# Patient Record
Sex: Male | Born: 1960 | Race: White | Hispanic: No | Marital: Married | Smoking: Never smoker
Health system: Southern US, Community
[De-identification: ages and names within clinical notes are randomized; demographics above are authoritative.]

## PROBLEM LIST (undated history)

## (undated) DIAGNOSIS — I471 Supraventricular tachycardia: Secondary | ICD-10-CM

## (undated) DIAGNOSIS — K219 Gastro-esophageal reflux disease without esophagitis: Secondary | ICD-10-CM

## (undated) DIAGNOSIS — I2510 Atherosclerotic heart disease of native coronary artery without angina pectoris: Secondary | ICD-10-CM

## (undated) DIAGNOSIS — F419 Anxiety disorder, unspecified: Secondary | ICD-10-CM

## (undated) DIAGNOSIS — I4719 Other supraventricular tachycardia: Secondary | ICD-10-CM

## (undated) DIAGNOSIS — M1990 Unspecified osteoarthritis, unspecified site: Secondary | ICD-10-CM

## (undated) DIAGNOSIS — E785 Hyperlipidemia, unspecified: Secondary | ICD-10-CM

## (undated) DIAGNOSIS — I10 Essential (primary) hypertension: Secondary | ICD-10-CM

## (undated) HISTORY — DX: Hyperlipidemia, unspecified: E78.5

## (undated) HISTORY — DX: Other supraventricular tachycardia: I47.19

## (undated) HISTORY — PX: VASECTOMY: SHX75

## (undated) HISTORY — DX: Anxiety disorder, unspecified: F41.9

## (undated) HISTORY — DX: Essential (primary) hypertension: I10

## (undated) HISTORY — DX: Thoracic aortic ectasia: I77.810

## (undated) HISTORY — PX: CARDIAC CATHETERIZATION: SHX172

## (undated) HISTORY — PX: COLONOSCOPY: SHX174

## (undated) HISTORY — DX: Supraventricular tachycardia: I47.1

## (undated) HISTORY — DX: Unspecified osteoarthritis, unspecified site: M19.90

## (undated) HISTORY — DX: Gastro-esophageal reflux disease without esophagitis: K21.9

---

## 2001-12-14 ENCOUNTER — Ambulatory Visit (HOSPITAL_COMMUNITY): Admission: RE | Admit: 2001-12-14 | Discharge: 2001-12-14 | Payer: Self-pay | Admitting: Gastroenterology

## 2015-12-25 ENCOUNTER — Encounter: Payer: Self-pay | Admitting: *Deleted

## 2016-01-07 ENCOUNTER — Ambulatory Visit (INDEPENDENT_AMBULATORY_CARE_PROVIDER_SITE_OTHER): Payer: 59 | Admitting: Cardiology

## 2016-01-07 ENCOUNTER — Encounter (INDEPENDENT_AMBULATORY_CARE_PROVIDER_SITE_OTHER): Payer: Self-pay

## 2016-01-07 ENCOUNTER — Encounter: Payer: Self-pay | Admitting: Cardiology

## 2016-01-07 VITALS — BP 132/82 | HR 86 | Ht 72.0 in | Wt 235.6 lb

## 2016-01-07 DIAGNOSIS — R0789 Other chest pain: Secondary | ICD-10-CM

## 2016-01-07 DIAGNOSIS — R002 Palpitations: Secondary | ICD-10-CM

## 2016-01-07 DIAGNOSIS — I10 Essential (primary) hypertension: Secondary | ICD-10-CM | POA: Diagnosis not present

## 2016-01-07 NOTE — Progress Notes (Signed)
Cardiology Office Note    Date:  01/07/2016   ID:  Gabriel Navylan D Dorris, DOB [birth year], MRN [id number]  PCP:  Duane LopeAlan Ross, MD  Cardiologist:   Donato SchultzMark Janashia Parco, MD     History of Present Illness:  Gabriel Jackson is a 55 y.o. male here for evaluation of chest pain, irregular heartbeats at the request of Dr. Duane LopeAlan Ross.  Light chest pain-pinpoint comes and goes, left sided, eases off after a few seconds., at night when lay down, can feel a little flutter at times. Mild palps when driving. Increased his BP med he states. Prozac. Feels better.   Heavy activity in yard. He was pulling brush and did not have any chest discomfort. He was short of breath which he attributes to being out of shape. He has not exercised frequently in the past several years. He does have a rowing machine at home and would like to restart.  He denies any syncope, bleeding, orthopnea, PND, rashes, headaches  Occasionally will take Cialis for erectile dysfunction. He asked me about supplement therapy.  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 Stressful at times.   Mother - DM.    Past Medical History:  Diagnosis Date  . Anxiety   . GERD (gastroesophageal reflux disease)   . HTN (hypertension)   . OA (osteoarthritis)     Past Surgical History:  Procedure Laterality Date  . COLONOSCOPY    . VASECTOMY      Current Medications: Outpatient Medications Prior to Visit  Medication Sig Dispense Refill  . FLUoxetine (PROZAC) 20 MG tablet Take 20 mg by mouth daily.    . Multiple Vitamin (MULTIVITAMIN) capsule Take 1 capsule by mouth daily.    Marland Kitchen. omeprazole (PRILOSEC) 20 MG capsule Take 20 mg by mouth daily.    . tadalafil (CIALIS) 20 MG tablet Take 20 mg by mouth daily as needed for erectile dysfunction.    Marland Kitchen. losartan-hydrochlorothiazide (HYZAAR) 100-12.5 MG tablet Take 1 tablet by mouth daily.     No facility-administered medications prior to visit.      Allergies:   Proair hfa  [albuterol] and Zithromax [azithromycin]   Social History   Social History  . Marital status: Married    Spouse name: N/A  . Number of children: N/A  . Years of education: N/A   Social History Main Topics  . Smoking status: Never Smoker  . Smokeless tobacco: Never Used  . Alcohol use Yes  . Drug use: No  . Sexual activity: Not Asked   Other Topics Concern  . None   Social History Narrative  . None     Family History:  The patient's family history includes Diabetes in his father; Healthy in his brother.   ROS:   Please see the history of present illness.    ROS All other systems reviewed and are negative.   PHYSICAL EXAM:   VS:  BP 132/82 (BP Location: Right Arm)   Pulse 86   Ht 6' (1.829 m)   Wt 235 lb 9.6 oz (106.9 kg)   BMI 31.95 kg/m    GEN: Well nourished, well developed, in no acute distress  HEENT: normal  Neck: no JVD, carotid bruits, or masses Cardiac: RRR; no murmurs, rubs, or gallops,no edema  Respiratory:  clear to auscultation bilaterally, normal work of breathing GI: soft, nontender, nondistended, + BS MS: no deformity or atrophy  Skin: warm and dry, no rash Neuro:  Alert and Oriented x 3,  Strength and sensation are intact Psych: euthymic mood, full affect  Wt Readings from Last 3 Encounters:  01/07/16 235 lb 9.6 oz (106.9 kg)      Studies/Labs Reviewed:   EKG:  EKG is ordered today.  The ekg ordered today demonstrates 01/07/16-sinus rhythm, 86, no other abnormalities. Personally reviewed-prior EKG from 11/28/13 shows sinus rhythm 61 with poor R wave progression.  Recent Labs: No results found for requested labs within last 8760 hours.   Lipid Panel No results found for: CHOL, TRIG, HDL, CHOLHDL, VLDL, LDLCALC, LDLDIRECT  Additional studies/ records that were reviewed today include:  Prior office notes, lab work, EKGs reviewed  Hemoglobin 15.2, creatinine 0.93, ALT 25, LDL 133, triglycerides 80, HDL 37  ASSESSMENT:    1. Atypical  chest pain   2. Palpitations   3. Essential hypertension      PLAN:  In order of problems listed above:  Chest pain  - Atypical, left-sided, described as pinpoint at times, last usually less than 1 minute does not appear to be exertional. I will check an exercise treadmill test to ensure that he does not have any high risk features.  - His EKG today was reassuring, this did not show any evidence of poor R-wave progression or old anterior infarct as was previously described in 2015.  Essential hypertension  - Very well controlled.  - Medications reviewed.  Palpitations  - Likely PACs or PVCs. Should be benign. He has no high-risk symptoms such as syncope. Checking exercise treadmill test.  Anxiety  - Prozac      Medication Adjustments/Labs and Tests Ordered: Current medicines are reviewed at length with the patient today.  Concerns regarding medicines are outlined above.  Medication changes, Labs and Tests ordered today are listed in the Patient Instructions below. Patient Instructions  Medication Instructions:  Your physician recommends that you continue on your current medications as directed. Please refer to the Current Medication list given to you today.   Labwork: None   Testing/Procedures: Your physician has requested that you have an exercise tolerance test. For further information please visit [email or web address]. Please also follow instruction sheet, as given.    Follow-Up: Your physician recommends that you schedule a follow-up appointment as needed with Dr Anne FuSkains.        If you need a refill on your cardiac medications before your next appointment, please call your pharmacy.      Signed, Donato SchultzMark Clee Pandit, MD  01/07/2016 12:16 PM    [facility] [address], [place], [place]  [address] Phone: [phone]; Fax: [phone]

## 2016-01-07 NOTE — Patient Instructions (Signed)
Medication Instructions:  Your physician recommends that you continue on your current medications as directed. Please refer to the Current Medication list given to you today.   Labwork: None   Testing/Procedures: Your physician has requested that you have an exercise tolerance test. For further information please visit [email or web address]. Please also follow instruction sheet, as given.    Follow-Up: Your physician recommends that you schedule a follow-up appointment as needed with Dr Rontae Inglett FuSkains.        If you need a refill on your cardiac medications before your next appointment, please call your pharmacy.

## 2016-01-18 ENCOUNTER — Ambulatory Visit (INDEPENDENT_AMBULATORY_CARE_PROVIDER_SITE_OTHER): Payer: 59

## 2016-01-18 DIAGNOSIS — R0789 Other chest pain: Secondary | ICD-10-CM | POA: Diagnosis not present

## 2016-01-18 LAB — EXERCISE TOLERANCE TEST
CHL CUP RESTING HR STRESS: 99 {beats}/min
CHL RATE OF PERCEIVED EXERTION: 17
CSEPEDS: 30 s
CSEPEW: 9.3 METS
Exercise duration (min): 7 min
MPHR: 165 {beats}/min
Peak HR: 173 {beats}/min
Percent HR: 104 %

## 2019-03-28 DIAGNOSIS — H20011 Primary iridocyclitis, right eye: Secondary | ICD-10-CM | POA: Diagnosis not present

## 2019-04-04 DIAGNOSIS — H20023 Recurrent acute iridocyclitis, bilateral: Secondary | ICD-10-CM | POA: Diagnosis not present

## 2019-04-05 DIAGNOSIS — J0110 Acute frontal sinusitis, unspecified: Secondary | ICD-10-CM | POA: Diagnosis not present

## 2019-04-05 DIAGNOSIS — K219 Gastro-esophageal reflux disease without esophagitis: Secondary | ICD-10-CM | POA: Diagnosis not present

## 2019-04-07 DIAGNOSIS — R519 Headache, unspecified: Secondary | ICD-10-CM | POA: Diagnosis not present

## 2019-04-21 DIAGNOSIS — H40013 Open angle with borderline findings, low risk, bilateral: Secondary | ICD-10-CM | POA: Diagnosis not present

## 2019-05-02 DIAGNOSIS — B0229 Other postherpetic nervous system involvement: Secondary | ICD-10-CM | POA: Diagnosis not present

## 2019-05-02 DIAGNOSIS — R0609 Other forms of dyspnea: Secondary | ICD-10-CM | POA: Diagnosis not present

## 2019-05-09 DIAGNOSIS — H26492 Other secondary cataract, left eye: Secondary | ICD-10-CM | POA: Diagnosis not present

## 2019-05-09 DIAGNOSIS — Z961 Presence of intraocular lens: Secondary | ICD-10-CM | POA: Diagnosis not present

## 2019-05-13 ENCOUNTER — Telehealth: Payer: Self-pay | Admitting: Radiology

## 2019-05-13 ENCOUNTER — Encounter: Payer: Self-pay | Admitting: Cardiology

## 2019-05-13 ENCOUNTER — Telehealth: Payer: Self-pay | Admitting: *Deleted

## 2019-05-13 ENCOUNTER — Other Ambulatory Visit: Payer: Self-pay

## 2019-05-13 ENCOUNTER — Ambulatory Visit: Payer: BC Managed Care – PPO | Admitting: Cardiology

## 2019-05-13 VITALS — BP 152/90 | HR 103 | Ht 72.0 in | Wt 238.2 lb

## 2019-05-13 DIAGNOSIS — R0689 Other abnormalities of breathing: Secondary | ICD-10-CM

## 2019-05-13 DIAGNOSIS — R072 Precordial pain: Secondary | ICD-10-CM | POA: Diagnosis not present

## 2019-05-13 DIAGNOSIS — R079 Chest pain, unspecified: Secondary | ICD-10-CM | POA: Diagnosis not present

## 2019-05-13 DIAGNOSIS — R002 Palpitations: Secondary | ICD-10-CM | POA: Diagnosis not present

## 2019-05-13 MED ORDER — METOPROLOL TARTRATE 100 MG PO TABS: 100.0000 mg | ORAL_TABLET | Freq: Two times a day (BID) | ORAL | 0 refills | Status: DC

## 2019-05-13 MED ORDER — METOPROLOL TARTRATE 100 MG PO TABS: 100.0000 mg | ORAL_TABLET | Freq: Once | ORAL | 0 refills | Status: DC

## 2019-05-13 NOTE — Patient Instructions (Addendum)
Medication Instructions:  Your physician recommends that you continue on your current medications as directed. Please refer to the Current Medication list given to you today.  *If you need a refill on your cardiac medications before your next appointment, please call your pharmacy*   Testing/Procedures: Your physician has recommended that you wear an event monitor. Event monitors are medical devices that record the heart's electrical activity. Doctors most often [place] these monitors to diagnose arrhythmias. Arrhythmias are problems with the speed or rhythm of the heartbeat. The monitor is a small, portable device. You can wear one while you do your normal daily activities. This is usually used to diagnose what is causing palpitations/syncope (passing out).  Your physician has requested that you have cardiac CT. Cardiac computed tomography (CT) is a painless test that uses an x-ray machine to take clear, detailed pictures of your heart. For further information please visit [email or web address]. Please follow instruction sheet as given.  Your physician has requested that you have an echocardiogram. Echocardiography is a painless test that uses sound waves to create images of your heart. It provides your doctor with information about the size and shape of your heart and how well your heart's chambers and valves are working. This procedure takes approximately one hour. There are no restrictions for this procedure.  Follow-Up: At [facility], you and your health needs are our priority.  As part of our continuing mission to provide you with exceptional heart care, we have created designated Provider Care Teams.  These Care Teams include your primary Cardiologist (physician) and Advanced Practice Providers (APPs -  Physician Assistants and Nurse Practitioners) who all work together to provide you with the care you need, when you need it.  We recommend signing up for the patient portal called "MyChart".  Sign  up information is provided on this After Visit Summary.  MyChart is used to connect with patients for Virtual Visits (Telemedicine).  Patients are able to view lab/test results, encounter notes, upcoming appointments, etc.  Non-urgent messages can be sent to your provider as well.   To learn more about what you can do with MyChart, go to [email or web address].    Your next appointment:   Follow up with Dr. Radford Pax as needed based on results of testing.  Other Instructions Your cardiac CT will be scheduled at:   [facility] [address] [place], [place] [address] [phone]  Please arrive at the [facility] main entrance of [facility] 30 minutes prior to test start time. Proceed to the [facility] Radiology Department (first floor) to check-in and test prep.  Please follow these instructions carefully (unless otherwise directed):  Hold all erectile dysfunction medications at least 3 days (72 hrs) prior to test.  On the Night Before the Test: . Be sure to Drink plenty of water. . Do not consume any caffeinated/decaffeinated beverages or chocolate 12 hours prior to your test. . Do not take any antihistamines 12 hours prior to your test. . If you take Metformin do not take 24 hours prior to test.  On the Day of the Test: . Drink plenty of water. Do not drink any water within one hour of the test. . Do not eat any food 4 hours prior to the test. . You may take your regular medications prior to the test.  . Take metoprolol (Lopressor) two hours prior to test. . HOLD Furosemide/Hydrochlorothiazide morning of the test.      After the Test: . Drink plenty of  water. . After receiving IV contrast, you may experience a mild flushed feeling. This is normal. . On occasion, you may experience a mild rash up to 24 hours after the test. This is not dangerous. If this occurs, you can take Benadryl 25 mg and increase your fluid intake. . If you experience trouble  breathing, this can be serious. If it is severe call 911 IMMEDIATELY. If it is mild, please call our office. . If you take any of these medications: Glipizide/Metformin, Avandament, Glucavance, please do not take 48 hours after completing test unless otherwise instructed.   Once we have confirmed authorization from your insurance company, we will call you to set up a date and time for your test.   For non-scheduling related questions, please contact the cardiac imaging nurse navigator should you have any questions/concerns: Rockwell Alexandria, RN Navigator Cardiac Imaging [facility] Heart and Vascular Services [phone] office

## 2019-05-13 NOTE — Addendum Note (Signed)
Addended by: Theresia Majors on: 05/13/2019 02:51 PM   Modules accepted: Orders

## 2019-05-13 NOTE — Progress Notes (Signed)
Cardiology Office Consult Note    Date:  05/13/2019   ID:  Gabriel Jackson, DOB 1960-03-15, MRN [id number]  PCP:  Lawerance Cruel, MD  Cardiologist:  Fransico Him, MD   Chief Complaint  Patient presents with  . New Patient (Initial Visit)    chest pain, palpitations    History of Present Illness:  Gabriel Jackson is a 59 y.o. male who is being seen today for the evaluation of chest pain at the request of Lawerance Cruel, MD.  This is a 59yo male with a hx of GERD, HTN and anxiety.  He was seen by Dr. Marlou Porch in 2017 with complaints of chest pain and palpitations and CP was felt to be atypical.  ETT showed no ischemia.  He is now referred back for further evaluation due to worsening of sx.    He tells me that a year ago he was able to exercise and carry heavy wood for a project he was building.  Since COVID 19 he has been working at a desk and not as active.  He has recently noticed that he is getting fatigued and SOB with minimal exertion.  Even going up a flight of stairs he gets very SOB and tired and now is having occasional CP.  He describes it as a gripping pain with no radiation into his arms or neck but is associated with nausea, diaphoresis and SOB if he is working out in the yard.  He says that even walking 25 steps causes SOB.  He also has been having what he calls palpitations where is he feels a hard heart beat when he lays in his right or left side that goes away when he is on his back.  It is not fast and not irregular. He has never smoked and neither parents have heart disease.     Past Medical History:  Diagnosis Date  . Anxiety   . GERD (gastroesophageal reflux disease)   . HTN (hypertension)   . OA (osteoarthritis)     Past Surgical History:  Procedure Laterality Date  . COLONOSCOPY    . VASECTOMY      Current Medications: Current Meds  Medication Sig  . gabapentin (NEURONTIN) 300 MG capsule Take 300 mg by mouth at bedtime.  . Glucosamine HCl  (GLUCOSAMINE PO) Take by mouth as directed.  . Multiple Vitamin (MULTIVITAMIN) capsule Take 1 capsule by mouth daily.  Marland Kitchen omeprazole (PRILOSEC) 20 MG capsule Take 20 mg by mouth daily.  . pantoprazole (PROTONIX) 40 MG tablet Take 40 mg by mouth daily.  . tadalafil (CIALIS) 20 MG tablet Take 20 mg by mouth daily as needed for erectile dysfunction.  Marland Kitchen VALTREX 500 MG tablet Take 500 mg by mouth daily.    Allergies:   Proair hfa [albuterol] and Zithromax [azithromycin]   Social History   Socioeconomic History  . Marital status: Married    Spouse name: Not on file  . Number of children: Not on file  . Years of education: Not on file  . Highest education level: Not on file  Occupational History  . Not on file  Tobacco Use  . Smoking status: Never Smoker  . Smokeless tobacco: Never Used  Substance and Sexual Activity  . Alcohol use: Yes  . Drug use: No  . Sexual activity: Not on file  Other Topics Concern  . Not on file  Social History Narrative  . Not on file   Social Determinants of Health  Financial Resource Strain:   . Difficulty of Paying Living Expenses: Not on file  Food Insecurity:   . Worried About Programme researcher, broadcasting/film/video in the Last Year: Not on file  . Ran Out of Food in the Last Year: Not on file  Transportation Needs:   . Lack of Transportation (Medical): Not on file  . Lack of Transportation (Non-Medical): Not on file  Physical Activity:   . Days of Exercise per Week: Not on file  . Minutes of Exercise per Session: Not on file  Stress:   . Feeling of Stress : Not on file  Social Connections:   . Frequency of Communication with Friends and Family: Not on file  . Frequency of Social Gatherings with Friends and Family: Not on file  . Attends Religious Services: Not on file  . Active Member of Clubs or Organizations: Not on file  . Attends Banker Meetings: Not on file  . Marital Status: Not on file     Family History:  The patient's family history  includes Diabetes in his father; Healthy in his brother.   ROS:   Please see the history of present illness.    ROS All other systems reviewed and are negative.  PAD Screen 01/07/2016  Previous PAD dx? No  Previous surgical procedure? No  Pain with walking? No  Feet/toe relief with dangling? No  Painful, non-healing ulcers? No  Extremities discolored? No       PHYSICAL EXAM:   VS:  BP (!) 152/90   Pulse (!) 103   Ht 6' (1.829 m)   Wt 238 lb 3.2 oz (108 kg)   BMI 32.31 kg/m    GEN: Well nourished, well developed, in no acute distress  HEENT: normal  Neck: no JVD, carotid bruits, or masses Cardiac: RRR; no murmurs, rubs, or gallops,no edema.  Intact distal pulses bilaterally.  Respiratory:  clear to auscultation bilaterally, normal work of breathing GI: soft, nontender, nondistended, + BS MS: no deformity or atrophy  Skin: warm and dry, no rash Neuro:  Alert and Oriented x 3, Strength and sensation are intact Psych: euthymic mood, full affect  Wt Readings from Last 3 Encounters:  05/13/19 238 lb 3.2 oz (108 kg)  01/07/16 235 lb 9.6 oz (106.9 kg)      Studies/Labs Reviewed:   EKG:  EKG is ordered today.  The ekg ordered today demonstrates Stach at 103bpm with no ST changes  Recent Labs: No results found for requested labs within last 8760 hours.   Lipid Panel No results found for: CHOL, TRIG, HDL, CHOLHDL, VLDL, LDLCALC, LDLDIRECT  Additional studies/ records that were reviewed today include:   Office notes from Dr. Anne Fu in 2017 and ETT  In 2017  ASSESSMENT:    1. Chest pain of uncertain etiology   2. Palpitations   3. Gasping for breath   4. Precordial pain      PLAN:  In order of problems listed above:  1. Chest pain -there are typical and atypical components to his sx -it is exacerbated by exercise and associated with SOB and sometimes nausea and diaphoresis but no radiation of the discomfort. -EKG is normal -his CRFs include obesity, HTN -I  have recommended a Coronary CTA to assess for CAD.  His resting HR is 103bpm so will give Lopressor tartrate 100mg  prior to his CTA -he has been quite sedentary over the past year compared to previously and some of this may be due to deconditioning.  2.   Palpitations -he denies any fast heart beats or skipping of the heart beat and is just more aware of his heart beating at night -I will get an event monitor to assess further  3.  Awakening gasping for breath -his wife has noticed he snoring and he occasionally will appear to be gasping for breath in his sleep -he has had fatigue during the day as well -will get a home sleep study to rule out OSA    Medication Adjustments/Labs and Tests Ordered: Current medicines are reviewed at length with the patient today.  Concerns regarding medicines are outlined above.  Medication changes, Labs and Tests ordered today are listed in the Patient Instructions below.  Patient Instructions  Medication Instructions:  Your physician recommends that you continue on your current medications as directed. Please refer to the Current Medication list given to you today.  *If you need a refill on your cardiac medications before your next appointment, please call your pharmacy*   Testing/Procedures: Your physician has recommended that you wear an event monitor. Event monitors are medical devices that record the heart's electrical activity. Doctors most often [place] these monitors to diagnose arrhythmias. Arrhythmias are problems with the speed or rhythm of the heartbeat. The monitor is a small, portable device. You can wear one while you do your normal daily activities. This is usually used to diagnose what is causing palpitations/syncope (passing out).  Your physician has requested that you have cardiac CT. Cardiac computed tomography (CT) is a painless test that uses an x-ray machine to take clear, detailed pictures of your heart. For further information please  visit [email or web address] Please follow instruction sheet as given.  Your physician has requested that you have an echocardiogram. Echocardiography is a painless test that uses sound waves to create images of your heart. It provides your doctor with information about the size and shape of your heart and how well your heart's chambers and valves are working. This procedure takes approximately one hour. There are no restrictions for this procedure.  Follow-Up: At [facility], you and your health needs are our priority.  As part of our continuing mission to provide you with exceptional heart care, we have created designated Provider Care Teams.  These Care Teams include your primary Cardiologist (physician) and Advanced Practice Providers (APPs -  Physician Assistants and Nurse Practitioners) who all work together to provide you with the care you need, when you need it.  We recommend signing up for the patient portal called "MyChart".  Sign up information is provided on this After Visit Summary.  MyChart is used to connect with patients for Virtual Visits (Telemedicine).  Patients are able to view lab/test results, encounter notes, upcoming appointments, etc.  Non-urgent messages can be sent to your provider as well.   To learn more about what you can do with MyChart, go to [email or web address].    Your next appointment:   Follow up with Dr. Mayford Knife as needed based on results of testing.  Other Instructions Your cardiac CT will be scheduled at:   [facility] [address] [place], [place] [address] [phone]  Please arrive at the [facility] main entrance of [facility] 30 minutes prior to test start time. Proceed to the [facility] Radiology Department (first floor) to check-in and test prep.  Please follow these instructions carefully (unless otherwise directed):  Hold all erectile dysfunction medications at least 3 days (72 hrs) prior to test.  On the Night  Before the Test: . Be sure to Drink plenty of water. . Do not consume any caffeinated/decaffeinated beverages or chocolate 12 hours prior to your test. . Do not take any antihistamines 12 hours prior to your test. . If you take Metformin do not take 24 hours prior to test.  On the Day of the Test: . Drink plenty of water. Do not drink any water within one hour of the test. . Do not eat any food 4 hours prior to the test. . You may take your regular medications prior to the test.  . Take metoprolol (Lopressor) two hours prior to test. . HOLD Furosemide/Hydrochlorothiazide morning of the test.      After the Test: . Drink plenty of water. . After receiving IV contrast, you may experience a mild flushed feeling. This is normal. . On occasion, you may experience a mild rash up to 24 hours after the test. This is not dangerous. If this occurs, you can take Benadryl 25 mg and increase your fluid intake. . If you experience trouble breathing, this can be serious. If it is severe call 911 IMMEDIATELY. If it is mild, please call our office. . If you take any of these medications: Glipizide/Metformin, Avandament, Glucavance, please do not take 48 hours after completing test unless otherwise instructed.   Once we have confirmed authorization from your insurance company, we will call you to set up a date and time for your test.   For non-scheduling related questions, please contact the cardiac imaging nurse navigator should you have any questions/concerns: Rockwell Alexandria, RN Navigator Cardiac Imaging [facility] Heart and Vascular Services [phone] office        Signed, Armanda Magic, MD  05/13/2019 2:41 PM    [facility] [address] [place], [place], [place]  [address] Phone: [phone]; Fax: [phone]

## 2019-05-13 NOTE — Telephone Encounter (Signed)
Enrolled patient for a 30 day [employer] Event monitor to be mailed to patients home.  

## 2019-05-13 NOTE — Telephone Encounter (Signed)
-----   Message from Theresia Majors, RN sent at 05/13/2019  2:52 PM EST ----- Home sleep study has been ordered for this patient. Thanks!

## 2019-05-16 ENCOUNTER — Ambulatory Visit (HOSPITAL_COMMUNITY): Payer: BC Managed Care – PPO | Attending: Cardiology

## 2019-05-16 ENCOUNTER — Other Ambulatory Visit: Payer: Self-pay

## 2019-05-16 DIAGNOSIS — R079 Chest pain, unspecified: Secondary | ICD-10-CM | POA: Insufficient documentation

## 2019-05-17 DIAGNOSIS — Z9841 Cataract extraction status, right eye: Secondary | ICD-10-CM | POA: Diagnosis not present

## 2019-05-19 ENCOUNTER — Telehealth: Payer: Self-pay | Admitting: *Deleted

## 2019-05-19 NOTE — Telephone Encounter (Signed)
Patient is aware and agreeable to Home Sleep Study through [facility]. Patient is scheduled for5/14/21at12 Pmto pick up home sleep kit and meet with Respiratory therapist at [facility]. Patient is aware that if this appointment date and time does not work for them they should contact [facility] directly at [phone]. Patient is aware that a sleep packet will be sent from [facility] in week. Left detailed message on voicemail with date and time of SS and informed patient to call back to confirm or reschedule.      

## 2019-05-19 NOTE — Telephone Encounter (Signed)
Staff message sent to Coralee [place] ok to schedule HST. Per Cherita S @ [employer] on today @ 12:50pm no PA is required.

## 2019-05-19 NOTE — Telephone Encounter (Signed)
  Gaynelle Cage, CMA  Reesa Chew, CMA  Ok to schedule HST. Per Cherita S @ [employer] no PA is required.

## 2019-05-20 ENCOUNTER — Ambulatory Visit (INDEPENDENT_AMBULATORY_CARE_PROVIDER_SITE_OTHER): Payer: BC Managed Care – PPO

## 2019-05-20 DIAGNOSIS — R002 Palpitations: Secondary | ICD-10-CM

## 2019-05-20 DIAGNOSIS — H40041 Steroid responder, right eye: Secondary | ICD-10-CM | POA: Diagnosis not present

## 2019-05-30 DIAGNOSIS — H26492 Other secondary cataract, left eye: Secondary | ICD-10-CM | POA: Diagnosis not present

## 2019-06-01 ENCOUNTER — Ambulatory Visit: Payer: 59 | Admitting: Cardiology

## 2019-06-03 ENCOUNTER — Inpatient Hospital Stay (HOSPITAL_COMMUNITY)
Admission: EM | Admit: 2019-06-03 | Discharge: 2019-06-07 | DRG: 247 | Disposition: A | Discharge status: Home / Self Care | Payer: BC Managed Care – PPO | Attending: Cardiology | Admitting: Cardiology

## 2019-06-03 ENCOUNTER — Telehealth: Payer: Self-pay | Admitting: Cardiology

## 2019-06-03 ENCOUNTER — Other Ambulatory Visit: Payer: Self-pay

## 2019-06-03 ENCOUNTER — Encounter (HOSPITAL_COMMUNITY): Payer: Self-pay

## 2019-06-03 ENCOUNTER — Emergency Department (HOSPITAL_COMMUNITY): Payer: BC Managed Care – PPO

## 2019-06-03 DIAGNOSIS — F419 Anxiety disorder, unspecified: Secondary | ICD-10-CM | POA: Diagnosis present

## 2019-06-03 DIAGNOSIS — R079 Chest pain, unspecified: Secondary | ICD-10-CM

## 2019-06-03 DIAGNOSIS — K219 Gastro-esophageal reflux disease without esophagitis: Secondary | ICD-10-CM | POA: Diagnosis not present

## 2019-06-03 DIAGNOSIS — I10 Essential (primary) hypertension: Secondary | ICD-10-CM | POA: Diagnosis not present

## 2019-06-03 DIAGNOSIS — R0602 Shortness of breath: Secondary | ICD-10-CM | POA: Diagnosis not present

## 2019-06-03 DIAGNOSIS — Z833 Family history of diabetes mellitus: Secondary | ICD-10-CM

## 2019-06-03 DIAGNOSIS — E669 Obesity, unspecified: Secondary | ICD-10-CM | POA: Diagnosis not present

## 2019-06-03 DIAGNOSIS — Z20822 Contact with and (suspected) exposure to covid-19: Secondary | ICD-10-CM | POA: Diagnosis present

## 2019-06-03 DIAGNOSIS — R0789 Other chest pain: Secondary | ICD-10-CM | POA: Diagnosis not present

## 2019-06-03 DIAGNOSIS — I25110 Atherosclerotic heart disease of native coronary artery with unstable angina pectoris: Principal | ICD-10-CM | POA: Diagnosis present

## 2019-06-03 DIAGNOSIS — Z6833 Body mass index (BMI) 33.0-33.9, adult: Secondary | ICD-10-CM

## 2019-06-03 DIAGNOSIS — E785 Hyperlipidemia, unspecified: Secondary | ICD-10-CM | POA: Diagnosis not present

## 2019-06-03 DIAGNOSIS — I2510 Atherosclerotic heart disease of native coronary artery without angina pectoris: Secondary | ICD-10-CM | POA: Diagnosis not present

## 2019-06-03 DIAGNOSIS — I200 Unstable angina: Secondary | ICD-10-CM | POA: Diagnosis not present

## 2019-06-03 DIAGNOSIS — I259 Chronic ischemic heart disease, unspecified: Secondary | ICD-10-CM | POA: Diagnosis not present

## 2019-06-03 DIAGNOSIS — Z955 Presence of coronary angioplasty implant and graft: Secondary | ICD-10-CM

## 2019-06-03 DIAGNOSIS — Z79899 Other long term (current) drug therapy: Secondary | ICD-10-CM | POA: Diagnosis not present

## 2019-06-03 DIAGNOSIS — R931 Abnormal findings on diagnostic imaging of heart and coronary circulation: Secondary | ICD-10-CM | POA: Diagnosis not present

## 2019-06-03 HISTORY — DX: Atherosclerotic heart disease of native coronary artery without angina pectoris: I25.10

## 2019-06-03 LAB — TROPONIN I (HIGH SENSITIVITY)
Troponin I (High Sensitivity): 9 ng/L (ref <18)
Troponin I (High Sensitivity): 8 ng/L (ref <18)

## 2019-06-03 LAB — CBC
HCT: 46.9 % (ref 39.0–52.0)
Hemoglobin: 15.8 g/dL (ref 13.0–17.0)
MCH: 29 pg (ref 26.0–34.0)
MCHC: 33.7 g/dL (ref 30.0–36.0)
MCV: 86.1 fL (ref 80.0–100.0)
Platelets: 231 10*3/uL (ref 150–400)
RBC: 5.45 MIL/uL (ref 4.22–5.81)
RDW: 13 % (ref 11.5–15.5)
WBC: 9.1 10*3/uL (ref 4.0–10.5)
nRBC: 0 % (ref 0.0–0.2)

## 2019-06-03 LAB — BASIC METABOLIC PANEL
Anion gap: 12 (ref 5–15)
BUN: 19 mg/dL (ref 6–20)
CO2: 25 mmol/L (ref 22–32)
Calcium: 9.8 mg/dL (ref 8.9–10.3)
Chloride: 105 mmol/L (ref 98–111)
Creatinine, Ser: 1.28 mg/dL — ABNORMAL HIGH (ref 0.61–1.24)
GFR calc Af Amer: >60 mL/min (ref >60)
GFR calc non Af Amer: >60 mL/min (ref >60)
Glucose, Bld: 111 mg/dL — ABNORMAL HIGH (ref 70–99)
Potassium: 3.5 mmol/L (ref 3.5–5.1)
Sodium: 142 mmol/L (ref 135–145)

## 2019-06-03 MED ORDER — OLMESARTAN MEDOXOMIL-HCTZ 40-25 MG PO TABS: 1.0000 | ORAL_TABLET | Freq: Every day | ORAL | Status: DC

## 2019-06-03 MED ORDER — PANTOPRAZOLE SODIUM 40 MG PO TBEC
40.0000 mg | DELAYED_RELEASE_TABLET | Freq: Every day | ORAL | Status: DC
  Administered 2019-06-04 – 2019-06-07 (×4): 40 mg via ORAL
  Filled 2019-06-03 (×4): qty 1

## 2019-06-03 MED ORDER — ADULT MULTIVITAMIN W/MINERALS CH
1.0000 | ORAL_TABLET | Freq: Every day | ORAL | Status: DC
  Administered 2019-06-04 – 2019-06-07 (×3): 1 via ORAL
  Filled 2019-06-03 (×3): qty 1

## 2019-06-03 MED ORDER — NITROGLYCERIN 0.4 MG SL SUBL
0.4000 mg | SUBLINGUAL_TABLET | SUBLINGUAL | Status: DC | PRN
  Administered 2019-06-05: 0.4 mg via SUBLINGUAL
  Filled 2019-06-03: qty 1

## 2019-06-03 MED ORDER — GABAPENTIN 300 MG PO CAPS
300.0000 mg | ORAL_CAPSULE | Freq: Every day | ORAL | Status: DC
  Administered 2019-06-04 – 2019-06-06 (×4): 300 mg via ORAL
  Filled 2019-06-03 (×4): qty 1

## 2019-06-03 MED ORDER — ASPIRIN EC 81 MG PO TBEC
81.0000 mg | DELAYED_RELEASE_TABLET | Freq: Every day | ORAL | Status: DC
  Administered 2019-06-04: 81 mg via ORAL
  Filled 2019-06-03: qty 1

## 2019-06-03 MED ORDER — ONDANSETRON HCL 4 MG/2ML IJ SOLN: 4.0000 mg | Freq: Four times a day (QID) | INTRAMUSCULAR | Status: DC | PRN

## 2019-06-03 MED ORDER — HYDROCHLOROTHIAZIDE 25 MG PO TABS
25.0000 mg | ORAL_TABLET | Freq: Every day | ORAL | Status: DC
  Administered 2019-06-04 – 2019-06-05 (×2): 25 mg via ORAL
  Filled 2019-06-03 (×2): qty 1

## 2019-06-03 MED ORDER — IRBESARTAN 150 MG PO TABS
300.0000 mg | ORAL_TABLET | Freq: Every day | ORAL | Status: DC
  Administered 2019-06-04 – 2019-06-07 (×4): 300 mg via ORAL
  Filled 2019-06-03 (×2): qty 2
  Filled 2019-06-03: qty 1
  Filled 2019-06-03: qty 2

## 2019-06-03 MED ORDER — ACETAMINOPHEN 325 MG PO TABS
650.0000 mg | ORAL_TABLET | ORAL | Status: DC | PRN
  Administered 2019-06-03 – 2019-06-06 (×4): 650 mg via ORAL
  Filled 2019-06-03 (×4): qty 2

## 2019-06-03 NOTE — ED Triage Notes (Signed)
Pt reports chest tightness and Sob intermittently since last week. Pt scheduled for CT of his chest next week, has holter monitor on currently. Pt a.o, resp e.u

## 2019-06-03 NOTE — Telephone Encounter (Signed)
I am very worried about his sx and I think ne needs to go to the ER to get assessed

## 2019-06-03 NOTE — Telephone Encounter (Signed)
Patient states that Wednesday evening he had chest pain while he was doing work in his yard. He states that it was worse than previous episodes that he has had. He states that his chest tightened up and he felt like someone was squeezing it. He stopped what he was doing and went inside and rested and took an aspirin. His BP was 180/101 and HR 100. He states that he recovered quickly after he rested and chest pain went away. Blood pressure came down to 136/84. He states that he gets short of breath with some chest tightness whenever he exerts himself but this is the worst it has been. He also states that sometimes after he eats that he notices chest pain more, which could be due to GERD. He also reports that he notices his heart racing when he takes deep breaths.  Patient is currently wearing a heart monitor and registered an event when he had this episode on Wednesday. Patient is scheduled for coronary CT on 04/02.  Advised patient that if he develops chest pain again with associated symptoms that is not relieved that he should go to the ER. Patient verbalized understanding.

## 2019-06-03 NOTE — H&P (Signed)
Cardiology Admission History and Physical:   Patient ID: Gabriel Jackson MRN: [id number]; DOB: 07/16/1960   Admission date: 06/03/2019  Primary Care Provider: Daisy Floro, MD Primary Cardiologist: Dr. Mayford Knife Primary Electrophysiologist:  None   Chief Complaint:  Chest pain  Patient Profile:   Gabriel Jackson is a 59 y.o. male with anxiety, GERD, HTN presents with chest pain.   History of Present Illness:   [name] presents for new onset chest pain. He has recently established care with Dr Mayford Knife in cardiology.  For the past several months the patient has described symptoms of exertional shortness of breath and fatigue with palpitations.  He currently is wearing a heart rhythm monitor for these symptoms.  He is scheduled for a coronary CTA in 1 week for noninvasive coronary evaluation to work-up his symptoms.  2 days ago, the patient describes an episode of chest pain.  He describes this as a "squeezing sensation in the center of his chest."  He has never had chest pain or pressure before, even during the episodes of shortness of breath described above.  Given that this was a new symptom for him, he called his cardiologist who recommended he come to the emergency department for evaluation.  On my assessment, the patient is asymptomatic.  He describes the episode occurring 2 days ago as central chest pressure.  There is no radiation and no back pain at the time.  This occurred while he was exerting himself outside.  He describes this as heavier exertion than normal.  The squeezing sensation lasted approximately 10 seconds and then resolved spontaneously with no treatment.  He has had no recurrence of the symptoms since this time.  He has however tried to limit his exertion to prevent this from happening again.  In the emergency department, the patient was found to be mildly hypertensive with systolic blood pressures in the 140s and 150s.  His heart rate was initially tachycardic to  the 110s, however this resolved to normal.  His ECG was without any ischemic changes.  His initial troponin was 9 and a follow-up troponin IV hours later was 8.  Cardiology was consulted for further management.  Heart Pathway Score:     Past Medical History:  Diagnosis Date  . Anxiety   . GERD (gastroesophageal reflux disease)   . HTN (hypertension)   . OA (osteoarthritis)     Past Surgical History:  Procedure Laterality Date  . COLONOSCOPY    . VASECTOMY       Medications Prior to Admission: Prior to Admission medications   Medication Sig Start Date End Date Taking? Authorizing Provider  gabapentin (NEURONTIN) 300 MG capsule Take 300 mg by mouth at bedtime. 05/02/19   [provider]  Glucosamine HCl (GLUCOSAMINE PO) Take by mouth as directed.    [provider]  metoprolol tartrate (LOPRESSOR) 100 MG tablet Take 1 tablet (100 mg total) by mouth once for 1 dose. Take 1 tablet 2 hours prior to CT scan 05/13/19 05/13/19  Quintella Reichert, MD  Multiple Vitamin (MULTIVITAMIN) capsule Take 1 capsule by mouth daily.    [provider]  omeprazole (PRILOSEC) 20 MG capsule Take 20 mg by mouth daily.    [provider]  pantoprazole (PROTONIX) 40 MG tablet Take 40 mg by mouth daily. 04/07/19   [provider]  tadalafil (CIALIS) 20 MG tablet Take 20 mg by mouth daily as needed for erectile dysfunction.    [provider]  VALTREX 500  MG tablet Take 500 mg by mouth daily. 05/09/19   [provider]     Allergies:    Allergies  Allergen Reactions  . Proair Hfa [Albuterol] Hives  . Zithromax [Azithromycin] Hives    Social History:   Social History   Socioeconomic History  . Marital status: Married    Spouse name: Not on file  . Number of children: Not on file  . Years of education: Not on file  . Highest education level: Not on file  Occupational History  . Not on file  Tobacco Use  . Smoking status: Never Smoker  .  Smokeless tobacco: Never Used  Substance and Sexual Activity  . Alcohol use: Yes  . Drug use: No  . Sexual activity: Not on file  Other Topics Concern  . Not on file  Social History Narrative  . Not on file   Social Determinants of Health   Financial Resource Strain:   . Difficulty of Paying Living Expenses:   Food Insecurity:   . Worried About Programme researcher, broadcasting/film/video in the Last Year:   . Barista in the Last Year:   Transportation Needs:   . Freight forwarder (Medical):   Marland Kitchen Lack of Transportation (Non-Medical):   Physical Activity:   . Days of Exercise per Week:   . Minutes of Exercise per Session:   Stress:   . Feeling of Stress :   Social Connections:   . Frequency of Communication with Friends and Family:   . Frequency of Social Gatherings with Friends and Family:   . Attends Religious Services:   . Active Member of Clubs or Organizations:   . Attends Banker Meetings:   Marland Kitchen Marital Status:   Intimate Partner Violence:   . Fear of Current or Ex-Partner:   . Emotionally Abused:   Marland Kitchen Physically Abused:   . Sexually Abused:     Family History:   The patient's family history includes Diabetes in his father; Healthy in his brother.    ROS:  Please see the history of present illness.  All other ROS reviewed and negative.     Physical Exam/Data:   Vitals:   06/03/19 1502 06/03/19 1504 06/03/19 1720 06/03/19 2045  BP:  (!) 167/105 (!) 158/100 (!) 146/108  Pulse:  (!) 117 86 82  Resp:  16 17 13   Temp:  98.8 F (37.1 C) 98.3 F (36.8 C)   TempSrc:  Oral Oral   SpO2:  97% 100% 97%  Weight: 104.3 kg     Height: 5\' 9"  (1.753 m)      No intake or output data in the 24 hours ending 06/03/19 2057 Last 3 Weights 06/03/2019 05/13/2019 01/07/2016  Weight (lbs) 230 lb 238 lb 3.2 oz 235 lb 9.6 oz  Weight (kg) 104.327 kg 108.047 kg 106.867 kg     Body mass index is 33.97 kg/m.  General:  Well nourished, well developed, in no acute distress HEENT:  normal Lymph: no adenopathy Neck: no JVD Endocrine:  No thryomegaly Vascular: No carotid bruits; FA pulses 2+ bilaterally without bruits  Cardiac:  normal S1, S2; RRR; no murmur  Lungs:  clear to auscultation bilaterally, no wheezing, rhonchi or rales  Abd: soft, nontender, no hepatomegaly  Ext: no edema Musculoskeletal:  No deformities, BUE and BLE strength normal and equal Skin: warm and dry  Neuro:  CNs 2-12 intact, no focal abnormalities noted Psych:  Normal affect    EKG:  The  ECG that was done on arrival to the ED was personally reviewed and demonstrates sinus tachycardia, HR 113, possible LVH, possible inferior infarct (Q waves in III and aVF), no ST or T wave changes suggestive of acute ischemia.   Relevant CV Studies: Echo from 05/2019   1. Left ventricular ejection fraction, by estimation, is 60 to 65%. The  left ventricle has normal function. The left ventricle has no regional  wall motion abnormalities. There is mild asymmetric left ventricular  hypertrophy of the basal-septal segment.  Left ventricular diastolic parameters were normal.  2. Right ventricular systolic function is normal. The right ventricular  size is normal. There is normal pulmonary artery systolic pressure.  3. The mitral valve is normal in structure. Trivial mitral valve  regurgitation. No evidence of mitral stenosis.  4. The aortic valve is tricuspid. Aortic valve regurgitation is not  visualized. No aortic stenosis is present.  5. The inferior vena cava is normal in size with greater than 50%  respiratory variability, suggesting right atrial pressure of 3 mmHg.  Laboratory Data:  High Sensitivity Troponin:   Recent Labs  Lab 06/03/19 1508  TROPONINIHS 9      Chemistry Recent Labs  Lab 06/03/19 1508  NA 142  K 3.5  CL 105  CO2 25  GLUCOSE 111*  BUN 19  CREATININE 1.28*  CALCIUM 9.8  GFRNONAA >60  GFRAA >60  ANIONGAP 12    No results for input(s): PROT, ALBUMIN, AST, ALT,  ALKPHOS, BILITOT in the last 168 hours. Hematology Recent Labs  Lab 06/03/19 1508  WBC 9.1  RBC 5.45  HGB 15.8  HCT 46.9  MCV 86.1  MCH 29.0  MCHC 33.7  RDW 13.0  PLT 231   BNPNo results for input(s): BNP, PROBNP in the last 168 hours.  DDimer No results for input(s): DDIMER in the last 168 hours.   Radiology/Studies:  DG Chest 2 View  Result Date: 06/03/2019 CLINICAL DATA:  Chest tightness and shortness of breath. EXAM: CHEST - 2 VIEW COMPARISON:  October 11, 2009 FINDINGS: The heart size and mediastinal contours are within normal limits. Both lungs are clear. The visualized skeletal structures are unremarkable. IMPRESSION: No active cardiopulmonary disease. Electronically Signed   By: Virgina Norfolk M.D.   On: 06/03/2019 15:17       HEAR Score (for undifferentiated chest pain):     2 for low risk of MACE  Assessment and Plan:  Gabriel Jackson is a 59 y.o. male with anxiety, GERD, HTN who presents with chest pain.   #) Chest pain: symptoms suggestive of stable angina given that this occurred with heavy exertion.  Reassuringly, the patient denies ever having any chest pain symptoms before or having any chest pain symptoms since.  He has not had any rest symptoms of chest pain or shortness of breath.  His risk factors for CAD include hypertension and obesity.  His ECG from today does reveal Q waves in lead III and aVF, which are different compared to prior ECGs where he has not had a Q-wave in aVF.  This does raise suspicion for coronary disease.  I discussed with the patient that he is at intermediate risk of MACE based on his risk factors and his story.  He is concerned about his new chest pain and would like to undergo work-up for this is soon as possible.  I think it is reasonable to admit him to the hospital for observation and have him undergo coronary CT angiogram as  was planned for next week.  He is amenable to this plan.  He currently does not have any symptoms suggestive of  unstable angina. - admit for observation - coronary CTA in the AM (ordered) - check lipids, A1c - start ASA 81mg  daily - SLN PRN - may need statin pending lipid panel and results from CTA  #) HTN:  - cont home benicar  Severity of Illness: The appropriate patient status for this patient is OBSERVATION. Observation status is judged to be reasonable and necessary in order to provide the required intensity of service to ensure the patient's safety. The patient's presenting symptoms, physical exam findings, and initial radiographic and laboratory data in the context of their medical condition is felt to place them at decreased risk for further clinical deterioration. Furthermore, it is anticipated that the patient will be medically stable for discharge from the hospital within 2 midnights of admission. The following factors support the patient status of observation.   " The patient's presenting symptoms include chest pain. " The physical exam findings include obesity. " The initial radiographic and laboratory data are new Q wave on ECG.     For questions or updates, please contact [facility] Please consult [email or web address] for contact info under        Signed, , MD  06/03/2019 8:57 PM '

## 2019-06-03 NOTE — Telephone Encounter (Signed)
Advised patient that he should go to the ER to be assessed. Patient was hesitant but verbalized understanding and will head to the ER after he makes some arrangements with his wife

## 2019-06-03 NOTE — Telephone Encounter (Signed)
Pt c/o of Chest Pain: STAT if CP now or developed within 24 hours  1. Are you having CP right now? no  2. Are you experiencing any other symptoms (ex. SOB, nausea, vomiting, sweating)? no  3. How long have you been experiencing CP? Only happened on Wednesday 06/01/2019  4. Is your CP continuous or coming and going? Came and went   5. Have you taken Nitroglycerin? No, states he took an Asprin   Patient states 06/01/2019 his chest tightened up on exertion. He states. He states he believes he is getting worse and his BP was 180/101. Please advise. ?

## 2019-06-03 NOTE — ED Provider Notes (Signed)
[facility] EMERGENCY DEPARTMENT Provider Note   CSN: [id number] Arrival date & time: 06/03/19  1457     History Chief Complaint  Patient presents with  . Chest Pain  . Shortness of Breath    Gabriel Jackson is a 59 y.o. male.  HPI Patient presents to the emergency department with several episodes of chest tightness and shortness of breath intermittently over the last week.  The patient states he had an episode on Wednesday night that was pretty significant became diaphoretic and short of breath and had to sit down he stated after about 30 minutes the symptoms seem to get better.  The patient states he had another episode last night that was not nearly as significant but called his cardiologist and they sent him to the emergency department.  Patient states that he did not take any medications prior to arrival for symptoms.  Patient states that he has had several heart attacks in the past.  He states he is currently wearing a Holter monitor as well.  The patient denies headache,blurred vision, neck pain, fever, cough, weakness, numbness, dizziness, anorexia, edema, abdominal pain, nausea, vomiting, diarrhea, rash, back pain, dysuria, hematemesis, bloody stool, near syncope, or syncope.  Patient states he is due to have a cardiac CT done next Friday.    Past Medical History:  Diagnosis Date  . Anxiety   . GERD (gastroesophageal reflux disease)   . HTN (hypertension)   . OA (osteoarthritis)     Patient Active Problem List   Diagnosis Date Noted  . Chest pain 06/03/2019    Past Surgical History:  Procedure Laterality Date  . COLONOSCOPY    . VASECTOMY         Family History  Problem Relation Age of Onset  . Diabetes Father   . Healthy Brother     Social History   Tobacco Use  . Smoking status: Never Smoker  . Smokeless tobacco: Never Used  Substance Use Topics  . Alcohol use: Yes  . Drug use: No    Home Medications Prior to Admission  medications   Medication Sig Start Date End Date Taking? Authorizing Provider  gabapentin (NEURONTIN) 300 MG capsule Take 300 mg by mouth at bedtime. 05/02/19  Yes [provider]  Glucosamine HCl (GLUCOSAMINE PO) Take 2 tablets by mouth daily.    Yes [provider]  Multiple Vitamin (MULTIVITAMIN) capsule Take 1 capsule by mouth daily.   Yes [provider]  Multiple Vitamins-Minerals (EMERGEN-C IMMUNE PO) Take 1 tablet by mouth daily.   Yes [provider]  olmesartan-hydrochlorothiazide (BENICAR HCT) 40-25 MG tablet Take 1 tablet by mouth daily. 05/30/19  Yes [provider]  omeprazole (PRILOSEC) 20 MG capsule Take 20 mg by mouth daily.   Yes [provider]  tadalafil (CIALIS) 20 MG tablet Take 20 mg by mouth daily as needed for erectile dysfunction.   Yes [provider]  pantoprazole (PROTONIX) 40 MG tablet Take 40 mg by mouth daily. 04/07/19   [provider]    Allergies    Proair hfa [albuterol] and Zithromax [azithromycin]  Review of Systems   Review of Systems All other systems negative except as documented in the HPI. All pertinent positives and negatives as reviewed in the HPI. Physical Exam Updated Vital Signs BP (!) 146/108   Pulse 82   Temp 98.3 F (36.8 C) (Oral)   Resp 13   Ht 5\' 9"  (1.753 m)   Wt 104.3 kg  SpO2 97%   BMI 33.97 kg/m   Physical Exam Vitals and nursing note reviewed.  Constitutional:      General: He is not in acute distress.    Appearance: He is well-developed.  HENT:     Head: Normocephalic and atraumatic.  Eyes:     Pupils: Pupils are equal, round, and reactive to light.  Cardiovascular:     Rate and Rhythm: Normal rate and regular rhythm.     Heart sounds: Normal heart sounds. No murmur. No friction rub. No gallop.   Pulmonary:     Effort: Pulmonary effort is normal. No respiratory distress.     Breath sounds: Normal breath sounds. No decreased breath sounds,  wheezing or rhonchi.  Abdominal:     General: Bowel sounds are normal. There is no distension.     Palpations: Abdomen is soft.     Tenderness: There is no abdominal tenderness.  Musculoskeletal:     Cervical back: Normal range of motion and neck supple.  Skin:    General: Skin is warm and dry.     Capillary Refill: Capillary refill takes less than 2 seconds.     Findings: No erythema or rash.  Neurological:     Mental Status: He is alert and oriented to person, place, and time.     Motor: No abnormal muscle tone.     Coordination: Coordination normal.  Psychiatric:        Behavior: Behavior normal.     ED Results / Procedures / Treatments   Labs (all labs ordered are listed, but only abnormal results are displayed) Labs Reviewed  BASIC METABOLIC PANEL - Abnormal; Notable for the following components:      Result Value   Glucose, Bld 111 (*)    Creatinine, Ser 1.28 (*)    All other components within normal limits  CBC  TROPONIN I (HIGH SENSITIVITY)  TROPONIN I (HIGH SENSITIVITY)    EKG None  Radiology DG Chest 2 View  Result Date: 06/03/2019 CLINICAL DATA:  Chest tightness and shortness of breath. EXAM: CHEST - 2 VIEW COMPARISON:  October 11, 2009 FINDINGS: The heart size and mediastinal contours are within normal limits. Both lungs are clear. The visualized skeletal structures are unremarkable. IMPRESSION: No active cardiopulmonary disease. Electronically Signed   By: Virgina Norfolk M.D.   On: 06/03/2019 15:17    Procedures Procedures (including critical care time)  Medications Ordered in ED Medications - No data to display  ED Course  I have reviewed the triage vital signs and the nursing notes.  Pertinent labs & imaging results that were available during my care of the patient were reviewed by me and considered in my medical decision making (see chart for details).    MDM Rules/Calculators/A&P                     I spoke with the cardiology fellow who is  on-call.  The patient may need admission for further evaluation of these episodes of chest tightness and pressure.  The patient is advised the plan and all questions were answered.  I did advise the patient of her plan to speak with the cardiology team. Final Clinical Impression(s) / ED Diagnoses Final diagnoses:  Chest pain, unspecified type    Rx / DC Orders ED Discharge Orders    None       Rebeca Allegra 06/03/19 2218    Valarie Merino, MD 06/10/19 [phone]

## 2019-06-04 ENCOUNTER — Observation Stay (HOSPITAL_COMMUNITY): Payer: BC Managed Care – PPO

## 2019-06-04 ENCOUNTER — Encounter (HOSPITAL_COMMUNITY): Payer: Self-pay | Admitting: Internal Medicine

## 2019-06-04 DIAGNOSIS — I259 Chronic ischemic heart disease, unspecified: Secondary | ICD-10-CM | POA: Diagnosis not present

## 2019-06-04 DIAGNOSIS — F419 Anxiety disorder, unspecified: Secondary | ICD-10-CM | POA: Diagnosis present

## 2019-06-04 DIAGNOSIS — Z6833 Body mass index (BMI) 33.0-33.9, adult: Secondary | ICD-10-CM | POA: Diagnosis not present

## 2019-06-04 DIAGNOSIS — Z79899 Other long term (current) drug therapy: Secondary | ICD-10-CM | POA: Diagnosis not present

## 2019-06-04 DIAGNOSIS — K219 Gastro-esophageal reflux disease without esophagitis: Secondary | ICD-10-CM | POA: Diagnosis present

## 2019-06-04 DIAGNOSIS — E669 Obesity, unspecified: Secondary | ICD-10-CM | POA: Diagnosis present

## 2019-06-04 DIAGNOSIS — R079 Chest pain, unspecified: Secondary | ICD-10-CM | POA: Diagnosis present

## 2019-06-04 DIAGNOSIS — I2510 Atherosclerotic heart disease of native coronary artery without angina pectoris: Secondary | ICD-10-CM | POA: Diagnosis not present

## 2019-06-04 DIAGNOSIS — Z833 Family history of diabetes mellitus: Secondary | ICD-10-CM | POA: Diagnosis not present

## 2019-06-04 DIAGNOSIS — I25110 Atherosclerotic heart disease of native coronary artery with unstable angina pectoris: Secondary | ICD-10-CM | POA: Diagnosis present

## 2019-06-04 DIAGNOSIS — I10 Essential (primary) hypertension: Secondary | ICD-10-CM | POA: Diagnosis present

## 2019-06-04 DIAGNOSIS — R931 Abnormal findings on diagnostic imaging of heart and coronary circulation: Secondary | ICD-10-CM | POA: Diagnosis not present

## 2019-06-04 DIAGNOSIS — E785 Hyperlipidemia, unspecified: Secondary | ICD-10-CM | POA: Diagnosis present

## 2019-06-04 DIAGNOSIS — Z20822 Contact with and (suspected) exposure to covid-19: Secondary | ICD-10-CM | POA: Diagnosis present

## 2019-06-04 DIAGNOSIS — I200 Unstable angina: Secondary | ICD-10-CM | POA: Diagnosis not present

## 2019-06-04 LAB — CBC
HCT: 43.1 % (ref 39.0–52.0)
Hemoglobin: 14.7 g/dL (ref 13.0–17.0)
MCH: 28.9 pg (ref 26.0–34.0)
MCHC: 34.1 g/dL (ref 30.0–36.0)
MCV: 84.8 fL (ref 80.0–100.0)
Platelets: 178 10*3/uL (ref 150–400)
RBC: 5.08 MIL/uL (ref 4.22–5.81)
RDW: 12.8 % (ref 11.5–15.5)
WBC: 6.6 10*3/uL (ref 4.0–10.5)
nRBC: 0 % (ref 0.0–0.2)

## 2019-06-04 LAB — SARS CORONAVIRUS 2 (TAT 6-24 HRS): SARS Coronavirus 2: NEGATIVE

## 2019-06-04 LAB — BASIC METABOLIC PANEL
Anion gap: 9 (ref 5–15)
BUN: 15 mg/dL (ref 6–20)
CO2: 28 mmol/L (ref 22–32)
Calcium: 9.3 mg/dL (ref 8.9–10.3)
Chloride: 104 mmol/L (ref 98–111)
Creatinine, Ser: 0.93 mg/dL (ref 0.61–1.24)
GFR calc Af Amer: >60 mL/min (ref >60)
GFR calc non Af Amer: >60 mL/min (ref >60)
Glucose, Bld: 107 mg/dL — ABNORMAL HIGH (ref 70–99)
Potassium: 3.5 mmol/L (ref 3.5–5.1)
Sodium: 141 mmol/L (ref 135–145)

## 2019-06-04 LAB — HIV ANTIBODY (ROUTINE TESTING W REFLEX): HIV Screen 4th Generation wRfx: NONREACTIVE

## 2019-06-04 LAB — LIPID PANEL
Cholesterol: 193 mg/dL (ref 0–200)
HDL: 40 mg/dL — ABNORMAL LOW (ref >40)
LDL Cholesterol: 140 mg/dL — ABNORMAL HIGH (ref 0–99)
Total CHOL/HDL Ratio: 4.8 RATIO
Triglycerides: 66 mg/dL (ref <150)
VLDL: 13 mg/dL (ref 0–40)

## 2019-06-04 LAB — HEMOGLOBIN A1C
Hgb A1c MFr Bld: 5.4 % (ref 4.8–5.6)
Mean Plasma Glucose: 108.28 mg/dL

## 2019-06-04 LAB — HEPARIN LEVEL (UNFRACTIONATED): Heparin Unfractionated: 0.24 IU/mL — ABNORMAL LOW (ref 0.30–0.70)

## 2019-06-04 MED ORDER — ASPIRIN 81 MG PO CHEW
81.0000 mg | CHEWABLE_TABLET | Freq: Every day | ORAL | Status: DC
  Administered 2019-06-05 – 2019-06-07 (×2): 81 mg via ORAL
  Filled 2019-06-04 (×2): qty 1

## 2019-06-04 MED ORDER — NITROGLYCERIN 0.4 MG SL SUBL
SUBLINGUAL_TABLET | SUBLINGUAL | Status: AC
  Administered 2019-06-04: 0.8 mg
  Filled 2019-06-04: qty 2

## 2019-06-04 MED ORDER — NITROGLYCERIN 0.4 MG SL SUBL: 0.4000 mg | SUBLINGUAL_TABLET | SUBLINGUAL | Status: AC | PRN

## 2019-06-04 MED ORDER — METOPROLOL TARTRATE 50 MG PO TABS
50.0000 mg | ORAL_TABLET | Freq: Once | ORAL | Status: AC
  Administered 2019-06-04: 50 mg via ORAL
  Filled 2019-06-04: qty 1

## 2019-06-04 MED ORDER — ATORVASTATIN CALCIUM 80 MG PO TABS
80.0000 mg | ORAL_TABLET | Freq: Every day | ORAL | Status: DC
  Administered 2019-06-04 – 2019-06-06 (×3): 80 mg via ORAL
  Filled 2019-06-04 (×3): qty 1

## 2019-06-04 MED ORDER — IOHEXOL 350 MG/ML SOLN
80.0000 mL | Freq: Once | INTRAVENOUS | Status: AC | PRN
  Administered 2019-06-04: 100 mL via INTRAVENOUS

## 2019-06-04 MED ORDER — HEPARIN (PORCINE) 25000 UT/250ML-% IV SOLN
1350.0000 [IU]/h | INTRAVENOUS | Status: DC
  Administered 2019-06-04: 1300 [IU]/h via INTRAVENOUS
  Administered 2019-06-05 (×2): 1500 [IU]/h via INTRAVENOUS
  Filled 2019-06-04 (×3): qty 250

## 2019-06-04 MED ORDER — HEPARIN BOLUS VIA INFUSION
4000.0000 [IU] | Freq: Once | INTRAVENOUS | Status: AC
  Administered 2019-06-04: 4000 [IU] via INTRAVENOUS
  Filled 2019-06-04: qty 4000

## 2019-06-04 NOTE — Progress Notes (Signed)
Coronary CT angio shows a very tight LAD stenosis.   Will start IV heparin Atorvastatin 80 mg a day ASA 81 mg a day   Cath on Monday   We have discussed risks, benefits, options. He understands and agrees to proceed.      Kristeen Miss, MD  06/04/2019 2:41 PM    [facility] [address] [place],  [address] [place], [place]  [address] Phone: [phone]; Fax: [phone]

## 2019-06-04 NOTE — Progress Notes (Addendum)
ANTICOAGULATION CONSULT NOTE   Pharmacy Consult for IV heparin Indication: CAD  Allergies  Allergen Reactions  . Proair Hfa [Albuterol] Hives  . Zithromax [Azithromycin] Hives    Patient Measurements: Height: 5\' 9"  (175.3 cm) Weight: 230 lb (104.3 kg) IBW/kg (Calculated) : 70.7 Heparin Dosing Weight: 93 kg  Vital Signs: Temp: 98.1 F (36.7 C) (03/27 1937) Temp Source: Oral (03/27 1937) BP: 139/94 (03/27 1937) Pulse Rate: 63 (03/27 1937)  Labs: Recent Labs    06/03/19 1508 06/03/19 1952 06/04/19 0840 06/04/19 2202  HGB 15.8  --  14.7  --   HCT 46.9  --  43.1  --   PLT 231  --  178  --   HEPARINUNFRC  --   --   --  0.24*  CREATININE 1.28*  --  0.93  --   TROPONINIHS 9 8  --   --     Estimated Creatinine Clearance: 103 mL/min (by C-G formula based on SCr of 0.93 mg/dL).   Medical History: Past Medical History:  Diagnosis Date  . Anxiety   . GERD (gastroesophageal reflux disease)   . HTN (hypertension)   . OA (osteoarthritis)     Medications:  Infusions:  . heparin 1,300 Units/hr (06/04/19 1515)    Assessment: 59 yo male admitted for chest pain, now with very tight LAD stenosis seen on coronary CT.  No known anticoagulation prior to admission.  Pharmacy asked to start IV heparin.  Planning cath on Monday.  Baseline Scr and CBC WNL.  3/27 PM update:  Heparin level low No issues per RN  Goal of Therapy:  Heparin level 0.3-0.7 units/ml Monitor platelets by anticoagulation protocol: Yes   Plan:  Inc heparin to 1500 units/hr Re-check heparin level with AM labs Daily heparin level and CBC  4/27, PharmD, BCPS Clinical Pharmacist Phone: [phone]

## 2019-06-04 NOTE — Progress Notes (Signed)
ANTICOAGULATION CONSULT NOTE - Initial Consult  Pharmacy Consult for IV heparin Indication: CAD  Allergies  Allergen Reactions  . Proair Hfa [Albuterol] Hives  . Zithromax [Azithromycin] Hives    Patient Measurements: Height: 5\' 9"  (175.3 cm) Weight: 230 lb (104.3 kg) IBW/kg (Calculated) : 70.7 Heparin Dosing Weight: 93 kg  Vital Signs: Temp: 98.1 F (36.7 C) (03/27 1240) Temp Source: Oral (03/27 1240) BP: 139/77 (03/27 1240) Pulse Rate: 63 (03/27 1240)  Labs: Recent Labs    06/03/19 1508 06/03/19 1952 06/04/19 0840  HGB 15.8  --  14.7  HCT 46.9  --  43.1  PLT 231  --  178  CREATININE 1.28*  --  0.93  TROPONINIHS 9 8  --     Estimated Creatinine Clearance: 103 mL/min (by C-G formula based on SCr of 0.93 mg/dL).   Medical History: Past Medical History:  Diagnosis Date  . Anxiety   . GERD (gastroesophageal reflux disease)   . HTN (hypertension)   . OA (osteoarthritis)     Medications:  Infusions:  . heparin 1,300 Units/hr (06/04/19 1515)    Assessment: 59 yo male admitted for chest pain, now with very tight LAD stenosis seen on coronary CT.  No known anticoagulation prior to admission.  Pharmacy asked to start IV heparin.  Planning cath on Monday.  Baseline Scr and CBC WNL.  Goal of Therapy:  Heparin level 0.3-0.7 units/ml Monitor platelets by anticoagulation protocol: Yes   Plan:  Give heparin 4000 unit IV bolus x 1 now. Then start heparin gtt at 1300 units/hr Check heparin level 6 hrs after gtt starts. Daily heparin level and CBC.  Tuesday, [facility] Clinical Pharmacist Phone [phone]  06/04/2019 3:47 PM

## 2019-06-04 NOTE — Progress Notes (Signed)
Progress Note  Patient Name: Gabriel Jackson Date of Encounter: 06/04/2019  Primary Cardiologist:  Radford Pax   Subjective   Gabriel Jackson is a 59 year old gentleman with a several month history of progressive anginal-like symptoms.  He was scheduled for an outpatient coronary CT angiogram but presented to the emergency room with worsening symptoms.  He now has anginal-like symptoms doing some light sweeping.  Currently not having episodes of chest pain.  He is scheduled for coronary CT angiogram today.  Inpatient Medications    Scheduled Meds: . aspirin EC  81 mg Oral Daily  . gabapentin  300 mg Oral QHS  . hydrochlorothiazide  25 mg Oral Daily  . irbesartan  300 mg Oral Daily  . multivitamin with minerals  1 tablet Oral Daily  . pantoprazole  40 mg Oral Daily   Continuous Infusions:  PRN Meds: acetaminophen, nitroGLYCERIN, ondansetron (ZOFRAN) IV   Vital Signs    Vitals:   06/03/19 2200 06/03/19 2300 06/03/19 2344 06/04/19 0545  BP: (!) 150/106 (!) 144/93 (!) 168/109 (!) 147/85  Pulse: 77 71 70 71  Resp: 17 14 16 16   Temp:   (!) 97.5 F (36.4 C) 97.8 F (36.6 C)  TempSrc:   Oral Oral  SpO2: 95% 93% 94% 97%  Weight:      Height:       No intake or output data in the 24 hours ending 06/04/19 1113 Last 3 Weights 06/03/2019 05/13/2019 01/07/2016  Weight (lbs) 230 lb 238 lb 3.2 oz 235 lb 9.6 oz  Weight (kg) 104.327 kg 108.047 kg 106.867 kg      Telemetry    NSR  - Personally Reviewed  ECG     NSR , no acute ST  Or T wave abn.- Personally Reviewed  Physical Exam   GEN: No acute distress.   Neck: No JVD Cardiac: RRR, no murmurs, rubs, or gallops.  Respiratory: Clear to auscultation bilaterally. GI: Soft, nontender, non-distended  MS: No edema; No deformity. Neuro:  Nonfocal  Psych: Normal affect   Labs    High Sensitivity Troponin:   Recent Labs  Lab 06/03/19 1508 06/03/19 1952  TROPONINIHS 9 8      Chemistry Recent Labs  Lab 06/03/19 1508  06/04/19 0840  NA 142 141  K 3.5 3.5  CL 105 104  CO2 25 28  GLUCOSE 111* 107*  BUN 19 15  CREATININE 1.28* 0.93  CALCIUM 9.8 9.3  GFRNONAA >60 >60  GFRAA >60 >60  ANIONGAP 12 9     Hematology Recent Labs  Lab 06/03/19 1508 06/04/19 0840  WBC 9.1 6.6  RBC 5.45 5.08  HGB 15.8 14.7  HCT 46.9 43.1  MCV 86.1 84.8  MCH 29.0 28.9  MCHC 33.7 34.1  RDW 13.0 12.8  PLT 231 178    BNPNo results for input(s): BNP, PROBNP in the last 168 hours.   DDimer No results for input(s): DDIMER in the last 168 hours.   Radiology    DG Chest 2 View  Result Date: 06/03/2019 CLINICAL DATA:  Chest tightness and shortness of breath. EXAM: CHEST - 2 VIEW COMPARISON:  October 11, 2009 FINDINGS: The heart size and mediastinal contours are within normal limits. Both lungs are clear. The visualized skeletal structures are unremarkable. IMPRESSION: No active cardiopulmonary disease. Electronically Signed   By: Virgina Norfolk M.D.   On: 06/03/2019 15:17    Cardiac Studies      Patient Profile     59 y.o. male admitted  with angina symptoms.  Assessment & Plan    1.  Unstable angina: Troponin levels are within normal limits.  He is scheduled for coronary CT angiogram today.  Given his symptoms I think there is a high likelihood that he may have obstructive coronary artery disease.  We discussed the fact that he would stay here for heart catheterization on Monday if he is found to have significant coronary artery disease.  HR is 61 .  He should be good for CCA .  2. HTN:    BP is still slightly elevated. He's on max dose Avapro, HCTZ,   Will add additional agents after CCA  - beta blockers vs. ccb         For questions or updates, please contact [facility] Please consult [email or web address] for contact info under        Signed, Kristeen Miss, MD  06/04/2019, 11:13 AM

## 2019-06-05 DIAGNOSIS — I25110 Atherosclerotic heart disease of native coronary artery with unstable angina pectoris: Secondary | ICD-10-CM | POA: Diagnosis not present

## 2019-06-05 DIAGNOSIS — I200 Unstable angina: Secondary | ICD-10-CM | POA: Diagnosis not present

## 2019-06-05 DIAGNOSIS — I10 Essential (primary) hypertension: Secondary | ICD-10-CM | POA: Diagnosis not present

## 2019-06-05 DIAGNOSIS — R079 Chest pain, unspecified: Secondary | ICD-10-CM | POA: Diagnosis not present

## 2019-06-05 DIAGNOSIS — R931 Abnormal findings on diagnostic imaging of heart and coronary circulation: Secondary | ICD-10-CM | POA: Diagnosis not present

## 2019-06-05 DIAGNOSIS — I259 Chronic ischemic heart disease, unspecified: Secondary | ICD-10-CM | POA: Diagnosis not present

## 2019-06-05 DIAGNOSIS — E785 Hyperlipidemia, unspecified: Secondary | ICD-10-CM | POA: Diagnosis not present

## 2019-06-05 DIAGNOSIS — Z20822 Contact with and (suspected) exposure to covid-19: Secondary | ICD-10-CM | POA: Diagnosis not present

## 2019-06-05 LAB — CBC
HCT: 44.8 % (ref 39.0–52.0)
Hemoglobin: 15.3 g/dL (ref 13.0–17.0)
MCH: 29.2 pg (ref 26.0–34.0)
MCHC: 34.2 g/dL (ref 30.0–36.0)
MCV: 85.5 fL (ref 80.0–100.0)
Platelets: 193 10*3/uL (ref 150–400)
RBC: 5.24 MIL/uL (ref 4.22–5.81)
RDW: 13.1 % (ref 11.5–15.5)
WBC: 8.9 10*3/uL (ref 4.0–10.5)
nRBC: 0 % (ref 0.0–0.2)

## 2019-06-05 LAB — HEPARIN LEVEL (UNFRACTIONATED)
Heparin Unfractionated: 0.48 IU/mL (ref 0.30–0.70)
Heparin Unfractionated: 0.58 IU/mL (ref 0.30–0.70)

## 2019-06-05 MED ORDER — METOPROLOL TARTRATE 12.5 MG HALF TABLET
12.5000 mg | ORAL_TABLET | Freq: Two times a day (BID) | ORAL | Status: DC
  Administered 2019-06-05 (×2): 12.5 mg via ORAL
  Filled 2019-06-05 (×2): qty 1

## 2019-06-05 MED ORDER — SODIUM CHLORIDE 0.9 % IV BOLUS
500.0000 mL | Freq: Once | INTRAVENOUS | Status: AC
  Administered 2019-06-05: 500 mL via INTRAVENOUS

## 2019-06-05 MED ORDER — SODIUM CHLORIDE 0.9% FLUSH: 3.0000 mL | Freq: Two times a day (BID) | INTRAVENOUS | Status: DC

## 2019-06-05 MED ORDER — SODIUM CHLORIDE 0.9 % IV SOLN: 250.0000 mL | INTRAVENOUS | Status: DC | PRN

## 2019-06-05 MED ORDER — ASPIRIN 81 MG PO CHEW
81.0000 mg | CHEWABLE_TABLET | ORAL | Status: AC
  Administered 2019-06-06: 81 mg via ORAL
  Filled 2019-06-05: qty 1

## 2019-06-05 MED ORDER — SODIUM CHLORIDE 0.9 % WEIGHT BASED INFUSION: 1.0000 mL/kg/h | INTRAVENOUS | Status: DC

## 2019-06-05 MED ORDER — SODIUM CHLORIDE 0.9% FLUSH: 3.0000 mL | INTRAVENOUS | Status: DC | PRN

## 2019-06-05 MED ORDER — SODIUM CHLORIDE 0.9 % WEIGHT BASED INFUSION: 3.0000 mL/kg/h | INTRAVENOUS | Status: DC

## 2019-06-05 NOTE — Progress Notes (Signed)
Progress Note  Patient Name: Gabriel Jackson Date of Encounter: 06/05/2019  Primary Cardiologist: New  Subjective  No chest pain, no sob  Inpatient Medications    Scheduled Meds: . aspirin  81 mg Oral Daily  . atorvastatin  80 mg Oral q1800  . gabapentin  300 mg Oral QHS  . hydrochlorothiazide  25 mg Oral Daily  . irbesartan  300 mg Oral Daily  . multivitamin with minerals  1 tablet Oral Daily  . pantoprazole  40 mg Oral Daily   Continuous Infusions: . heparin 1,500 Units/hr (06/05/19 0418)   PRN Meds: acetaminophen, nitroGLYCERIN, nitroGLYCERIN, ondansetron (ZOFRAN) IV   Vital Signs    Vitals:   06/04/19 1937 06/04/19 2344 06/05/19 0419 06/05/19 0808  BP: (!) 139/94 116/79 129/90 (!) 140/98  Pulse: 63 67 67 79  Resp:      Temp: 98.1 F (36.7 C) 98.1 F (36.7 C) 97.8 F (36.6 C) (!) 97.5 F (36.4 C)  TempSrc: Oral Oral Oral Oral  SpO2: 98% 97% 98% 97%  Weight:   103.7 kg   Height:        Intake/Output Summary (Last 24 hours) at 06/05/2019 0911 Last data filed at 06/04/2019 2000 Gross per 24 hour  Intake 650 ml  Output --  Net 650 ml   Last 3 Weights 06/05/2019 06/03/2019 05/13/2019  Weight (lbs) 228 lb 11.2 oz 230 lb 238 lb 3.2 oz  Weight (kg) 103.738 kg 104.327 kg 108.047 kg      Telemetry    SR - Personally Reviewed  ECG    n/a - Personally Reviewed  Physical Exam   GEN: No acute distress.   Neck: No JVD Cardiac: RRR, no murmurs, rubs, or gallops.  Respiratory: Clear to auscultation bilaterally. GI: Soft, nontender, non-distended  MS: No edema; No deformity. Neuro:  Nonfocal  Psych: Normal affect   Labs    High Sensitivity Troponin:   Recent Labs  Lab 06/03/19 1508 06/03/19 1952  TROPONINIHS 9 8      Chemistry Recent Labs  Lab 06/03/19 1508 06/04/19 0840  NA 142 141  K 3.5 3.5  CL 105 104  CO2 25 28  GLUCOSE 111* 107*  BUN 19 15  CREATININE 1.28* 0.93  CALCIUM 9.8 9.3  GFRNONAA >60 >60  GFRAA >60 >60  ANIONGAP 12 9      Hematology Recent Labs  Lab 06/03/19 1508 06/04/19 0840 06/05/19 0509  WBC 9.1 6.6 8.9  RBC 5.45 5.08 5.24  HGB 15.8 14.7 15.3  HCT 46.9 43.1 44.8  MCV 86.1 84.8 85.5  MCH 29.0 28.9 29.2  MCHC 33.7 34.1 34.2  RDW 13.0 12.8 13.1  PLT 231 178 193    BNPNo results for input(s): BNP, PROBNP in the last 168 hours.   DDimer No results for input(s): DDIMER in the last 168 hours.   Radiology    DG Chest 2 View  Result Date: 06/03/2019 CLINICAL DATA:  Chest tightness and shortness of breath. EXAM: CHEST - 2 VIEW COMPARISON:  October 11, 2009 FINDINGS: The heart size and mediastinal contours are within normal limits. Both lungs are clear. The visualized skeletal structures are unremarkable. IMPRESSION: No active cardiopulmonary disease. Electronically Signed   By: Aram Candela M.D.   On: 06/03/2019 15:17   CT CORONARY MORPH W/CTA COR W/SCORE W/CA W/CM &/OR WO/CM  Result Date: 06/04/2019 CLINICAL DATA:  [name] presents with chest pain EXAM: Cardiac/Coronary CTA TECHNIQUE: The patient was scanned on a [employer]. FINDINGS:  A 100 kV prospective scan was triggered in the descending thoracic aorta at 111 HU's. Axial non-contrast 3 mm slices were carried out through the heart. The data set was analyzed on a dedicated work station and scored using the [place]. Gantry rotation speed was 250 msecs and collimation was .6 mm. No beta blockade and 0.8 mg of sl NTG was given. The 3D data set was reconstructed in 5% intervals of the 67-82 % of the R-R cycle. Diastolic phases were analyzed on a dedicated work station using MPR, MIP and VRT modes. The patient received 80 cc of contrast. Coronary Arteries:  Normal coronary origin.  Right dominance. RCA is a large dominant artery that gives rise to PDA and PLA. There is noncalcified plaque at the ostium of the RCA causing mild (25-49%) stenosis Left main is a large artery that gives rise to LAD and LCX arteries. LAD is a large vessel.  Calcified plaque in the proximal LAD causes minimal (0-24%) stenosis. Noncalcified plaque in the proximal LAD causes severe (70-99%) stenosis LCX is a non-dominant artery that gives rise to one large OM1 Gabriel Jackson. There is no plaque. Other findings: Left Ventricle: Normal size Left Atrium: Mild enlargement Pulmonary Veins: Normal configuration Right Ventricle: Moderate dilatation Right Atrium: Mild enlargement Cardiac valves: No calcifications Thoracic aorta: Normal size Pulmonary Arteries: Normal size Systemic Veins: Normal drainage Pericardium: Normal thickness IMPRESSION: 1. Noncalcified plaque in the proximal LAD causes severe (70-99%) stenosis 2. Noncalcified plaque at the ostium of the RCA causes mild (25-49%) stenosis 3. Coronary calcium score of 17. This was 49th percentile for age and sex matched control. CAD-RADS 4 Severe stenosis. (70-99%). Cardiac catheterization is recommended. Consider symptom-guided anti-ischemic pharmacotherapy as well as risk factor modification per guideline directed care Electronically Signed   By: Oswaldo Milian MD   On: 06/04/2019 14:53    Cardiac Studies    Patient Profile      Gabriel Jackson is a 59 year old gentleman with a several month history of progressive anginal-like symptoms.  He was scheduled for an outpatient coronary CT angiogram but presented to the emergency room with worsening symptoms.  He now has anginal-like symptoms doing some light sweeping.  Currently not having episodes of chest pain.  He is scheduled for coronary CT angiogram today.  Assessment & Plan    1. Unstable angina - trops neg this admit, EKG without specific ischemic changes - 05/2019 echo LVEF 60-65%, no WMAs - coronary CTA prox LAD 70-99%, RCA 25-49%  - plan for cath Monday - medical therapy with ASA 81, atorva 80, hep gtt, irbesartan 300. Start lopressor 12.5mg  bid.     I have reviewed the risks, indications, and alternatives to cardiac catheterization, possible  angioplasty, and stenting with the patient today. Risks include but are not limited to bleeding, infection, vascular injury, stroke, myocardial infection, arrhythmia, kidney injury, radiation-related injury in the case of prolonged fluoroscopy use, emergency cardiac surgery, and death. The patient understands the risks of serious complication is 1-2 in 9563 with diagnostic cardiac cath and 1-2% or less with angioplasty/stenting.    For questions or updates, please contact [facility] Please consult [email or web address] for contact info under        Signed, Carlyle Dolly, MD  06/05/2019, 9:11 AM

## 2019-06-05 NOTE — Progress Notes (Addendum)
ANTICOAGULATION CONSULT NOTE - Follow-Up Consult  Pharmacy Consult for IV heparin Indication: CAD  Allergies  Allergen Reactions  . Proair Hfa [Albuterol] Hives  . Zithromax [Azithromycin] Hives    Patient Measurements: Height: 5\' 9"  (175.3 cm) Weight: 228 lb 11.2 oz (103.7 kg) IBW/kg (Calculated) : 70.7 Heparin Dosing Weight: 93 kg  Vital Signs: Temp: 97.5 F (36.4 C) (03/28 0808) Temp Source: Oral (03/28 0808) BP: 140/98 (03/28 0808) Pulse Rate: 79 (03/28 0808)  Labs: Recent Labs    06/03/19 1508 06/03/19 1508 06/03/19 1952 06/04/19 0840 06/04/19 2202 06/05/19 0509  HGB 15.8   < >  --  14.7  --  15.3  HCT 46.9  --   --  43.1  --  44.8  PLT 231  --   --  178  --  193  HEPARINUNFRC  --   --   --   --  0.24* 0.48  CREATININE 1.28*  --   --  0.93  --   --   TROPONINIHS 9  --  8  --   --   --    < > = values in this interval not displayed.    Estimated Creatinine Clearance: 102.7 mL/min (by C-G formula based on SCr of 0.93 mg/dL).   Medical History: Past Medical History:  Diagnosis Date  . Anxiety   . GERD (gastroesophageal reflux disease)   . HTN (hypertension)   . OA (osteoarthritis)     Medications:  Infusions:  . heparin 1,500 Units/hr (06/05/19 0418)    Assessment: 59 yo male admitted for chest pain, now with very tight LAD stenosis seen on coronary CT.  No known anticoagulation prior to admission.  Pharmacy asked to start IV heparin.  Planning cath on Monday.  Baseline Scr and CBC WNL.  Heparin level now therapeutic on 1500 units/hr.  Goal of Therapy:  Heparin level 0.3-0.7 units/ml Monitor platelets by anticoagulation protocol: Yes   Plan:  Continue heparin gtt at 1500 units/hr Repeat heparin level at noon today for confirmation.  Daily heparin level and CBC.  Thursday, Pharm.D., BCPS Clinical Pharmacist Clinical phone for 06/05/2019 from 7:30-3:00 is [phone].  **Pharmacist phone directory can be found on [email or web address] listed under  [facility].  06/05/2019 9:03 AM   ADDENDUM 06/05/2019 2:50 PM  Confirmation heparin level remains therapeutic on 1500 units/hr.  No change.  Next level with AM labs.  06/07/2019, Pharm.D., BCPS Clinical Pharmacist

## 2019-06-05 NOTE — Progress Notes (Addendum)
Pt OOB to BR and HR up to 140's non sustained. Upon return to bed pt states bilateral arms felt tingly. EKG obtained with no significant changes noted. Pt denies CP or SOB. BP elevated 161/95 at the time of complaints. Pt is on IV heparin and awaiting cath in the am. Will continue to monitor. Dierdre Highman, RN   Provider on call Dr Deforest Hoyles notified via [facility].  2023 SL Ntg given per MD order. BP prior 145/94. 2025 BP 137/95 and  BP's continued to drop. Pt complained of nausea BP 94/71 and 2033 BP  83/44(see flow sheets. HOB flat and MD called by this RN and order received  for fluid bolus 500cc . See flowsheet for recorded VS  Upon completion of bolus BP 139/81 pt states tingling in arms and legs have improved but still minimally present. Will continue to monitor closely. Dierdre Highman, RN

## 2019-06-06 ENCOUNTER — Encounter (HOSPITAL_COMMUNITY)
Admission: EM | Disposition: A | Discharge status: Home / Self Care | Payer: Self-pay | Source: Home / Self Care | Attending: Cardiology

## 2019-06-06 ENCOUNTER — Encounter (HOSPITAL_COMMUNITY): Payer: Self-pay | Admitting: Internal Medicine

## 2019-06-06 DIAGNOSIS — E785 Hyperlipidemia, unspecified: Secondary | ICD-10-CM | POA: Diagnosis not present

## 2019-06-06 DIAGNOSIS — I200 Unstable angina: Secondary | ICD-10-CM | POA: Diagnosis not present

## 2019-06-06 DIAGNOSIS — I25110 Atherosclerotic heart disease of native coronary artery with unstable angina pectoris: Secondary | ICD-10-CM | POA: Diagnosis not present

## 2019-06-06 DIAGNOSIS — I259 Chronic ischemic heart disease, unspecified: Secondary | ICD-10-CM | POA: Diagnosis not present

## 2019-06-06 DIAGNOSIS — I10 Essential (primary) hypertension: Secondary | ICD-10-CM | POA: Diagnosis not present

## 2019-06-06 DIAGNOSIS — R931 Abnormal findings on diagnostic imaging of heart and coronary circulation: Secondary | ICD-10-CM | POA: Diagnosis not present

## 2019-06-06 HISTORY — PX: LEFT HEART CATH AND CORONARY ANGIOGRAPHY: CATH118249

## 2019-06-06 HISTORY — PX: CORONARY STENT INTERVENTION: CATH118234

## 2019-06-06 LAB — HEPARIN LEVEL (UNFRACTIONATED): Heparin Unfractionated: 0.72 IU/mL — ABNORMAL HIGH (ref 0.30–0.70)

## 2019-06-06 LAB — BASIC METABOLIC PANEL
Anion gap: 12 (ref 5–15)
BUN: 15 mg/dL (ref 6–20)
CO2: 28 mmol/L (ref 22–32)
Calcium: 9.2 mg/dL (ref 8.9–10.3)
Chloride: 102 mmol/L (ref 98–111)
Creatinine, Ser: 1.13 mg/dL (ref 0.61–1.24)
GFR calc Af Amer: >60 mL/min (ref >60)
GFR calc non Af Amer: >60 mL/min (ref >60)
Glucose, Bld: 115 mg/dL — ABNORMAL HIGH (ref 70–99)
Potassium: 3.9 mmol/L (ref 3.5–5.1)
Sodium: 142 mmol/L (ref 135–145)

## 2019-06-06 LAB — CBC
HCT: 45.9 % (ref 39.0–52.0)
Hemoglobin: 15.7 g/dL (ref 13.0–17.0)
MCH: 29.5 pg (ref 26.0–34.0)
MCHC: 34.2 g/dL (ref 30.0–36.0)
MCV: 86.1 fL (ref 80.0–100.0)
Platelets: 215 10*3/uL (ref 150–400)
RBC: 5.33 MIL/uL (ref 4.22–5.81)
RDW: 12.8 % (ref 11.5–15.5)
WBC: 10.9 10*3/uL — ABNORMAL HIGH (ref 4.0–10.5)
nRBC: 0 % (ref 0.0–0.2)

## 2019-06-06 LAB — POCT ACTIVATED CLOTTING TIME
Activated Clotting Time: 263 seconds
Activated Clotting Time: 268 seconds

## 2019-06-06 SURGERY — LEFT HEART CATH AND CORONARY ANGIOGRAPHY
Anesthesia: LOCAL

## 2019-06-06 MED ORDER — MIDAZOLAM HCL 2 MG/2ML IJ SOLN
INTRAMUSCULAR | Status: DC | PRN
  Administered 2019-06-06 (×2): 1 mg via INTRAVENOUS

## 2019-06-06 MED ORDER — FENTANYL CITRATE (PF) 100 MCG/2ML IJ SOLN
INTRAMUSCULAR | Status: AC
  Filled 2019-06-06: qty 2

## 2019-06-06 MED ORDER — NITROGLYCERIN 1 MG/10 ML FOR IR/CATH LAB
INTRA_ARTERIAL | Status: AC
  Filled 2019-06-06: qty 10

## 2019-06-06 MED ORDER — LABETALOL HCL 5 MG/ML IV SOLN
10.0000 mg | INTRAVENOUS | Status: AC | PRN
  Administered 2019-06-06: 10 mg via INTRAVENOUS
  Filled 2019-06-06: qty 4

## 2019-06-06 MED ORDER — LIDOCAINE HCL (PF) 1 % IJ SOLN
INTRAMUSCULAR | Status: DC | PRN
  Administered 2019-06-06: 2 mL

## 2019-06-06 MED ORDER — NITROGLYCERIN 1 MG/10 ML FOR IR/CATH LAB
INTRA_ARTERIAL | Status: DC | PRN
  Administered 2019-06-06: 200 ug via INTRACORONARY

## 2019-06-06 MED ORDER — HEPARIN SODIUM (PORCINE) 1000 UNIT/ML IJ SOLN
INTRAMUSCULAR | Status: DC | PRN
  Administered 2019-06-06 (×2): 5000 [IU] via INTRAVENOUS
  Administered 2019-06-06: 3000 [IU] via INTRAVENOUS
  Administered 2019-06-06: 2000 [IU] via INTRAVENOUS

## 2019-06-06 MED ORDER — PRASUGREL HCL 10 MG PO TABS
ORAL_TABLET | ORAL | Status: AC
  Filled 2019-06-06: qty 6

## 2019-06-06 MED ORDER — SODIUM CHLORIDE 0.9 % IV SOLN
INTRAVENOUS | Status: AC | PRN
  Administered 2019-06-06: 250 mL via INTRAVENOUS

## 2019-06-06 MED ORDER — LIDOCAINE HCL (PF) 1 % IJ SOLN
INTRAMUSCULAR | Status: AC
  Filled 2019-06-06: qty 30

## 2019-06-06 MED ORDER — HYDRALAZINE HCL 20 MG/ML IJ SOLN: 10.0000 mg | INTRAMUSCULAR | Status: AC | PRN

## 2019-06-06 MED ORDER — HEPARIN (PORCINE) IN NACL 1000-0.9 UT/500ML-% IV SOLN
INTRAVENOUS | Status: DC | PRN
  Administered 2019-06-06 (×2): 500 mL

## 2019-06-06 MED ORDER — VERAPAMIL HCL 2.5 MG/ML IV SOLN
INTRAVENOUS | Status: DC | PRN
  Administered 2019-06-06: 10 mL via INTRA_ARTERIAL

## 2019-06-06 MED ORDER — METOPROLOL TARTRATE 25 MG PO TABS
25.0000 mg | ORAL_TABLET | Freq: Two times a day (BID) | ORAL | Status: DC
  Administered 2019-06-06 – 2019-06-07 (×3): 25 mg via ORAL
  Filled 2019-06-06 (×3): qty 1

## 2019-06-06 MED ORDER — SODIUM CHLORIDE 0.9 % IV SOLN: 250.0000 mL | INTRAVENOUS | Status: DC | PRN

## 2019-06-06 MED ORDER — ENOXAPARIN SODIUM 40 MG/0.4ML ~~LOC~~ SOLN
40.0000 mg | SUBCUTANEOUS | Status: DC
  Administered 2019-06-07: 40 mg via SUBCUTANEOUS
  Filled 2019-06-06: qty 0.4

## 2019-06-06 MED ORDER — HYDROCHLOROTHIAZIDE 12.5 MG PO CAPS
12.5000 mg | ORAL_CAPSULE | Freq: Every day | ORAL | Status: DC
  Administered 2019-06-07: 12.5 mg via ORAL
  Filled 2019-06-06: qty 1

## 2019-06-06 MED ORDER — IOHEXOL 350 MG/ML SOLN
INTRAVENOUS | Status: DC | PRN
  Administered 2019-06-06: 140 mL via INTRA_ARTERIAL

## 2019-06-06 MED ORDER — HEPARIN SODIUM (PORCINE) 1000 UNIT/ML IJ SOLN
INTRAMUSCULAR | Status: AC
  Filled 2019-06-06: qty 1

## 2019-06-06 MED ORDER — SODIUM CHLORIDE 0.9% FLUSH: 3.0000 mL | INTRAVENOUS | Status: DC | PRN

## 2019-06-06 MED ORDER — PRASUGREL HCL 10 MG PO TABS
ORAL_TABLET | ORAL | Status: DC | PRN
  Administered 2019-06-06: 60 mg via ORAL

## 2019-06-06 MED ORDER — SODIUM CHLORIDE 0.9% FLUSH: 3.0000 mL | Freq: Two times a day (BID) | INTRAVENOUS | Status: DC

## 2019-06-06 MED ORDER — MIDAZOLAM HCL 2 MG/2ML IJ SOLN
INTRAMUSCULAR | Status: AC
  Filled 2019-06-06: qty 2

## 2019-06-06 MED ORDER — PRASUGREL HCL 10 MG PO TABS
10.0000 mg | ORAL_TABLET | Freq: Every day | ORAL | Status: DC
  Administered 2019-06-07: 10 mg via ORAL
  Filled 2019-06-06: qty 1

## 2019-06-06 MED ORDER — SODIUM CHLORIDE 0.9 % IV SOLN: INTRAVENOUS | Status: AC

## 2019-06-06 MED ORDER — HEPARIN (PORCINE) IN NACL 1000-0.9 UT/500ML-% IV SOLN
INTRAVENOUS | Status: AC
  Filled 2019-06-06: qty 1000

## 2019-06-06 MED ORDER — FENTANYL CITRATE (PF) 100 MCG/2ML IJ SOLN
INTRAMUSCULAR | Status: DC | PRN
  Administered 2019-06-06: 50 ug via INTRAVENOUS

## 2019-06-06 MED ORDER — VERAPAMIL HCL 2.5 MG/ML IV SOLN
INTRAVENOUS | Status: AC
  Filled 2019-06-06: qty 2

## 2019-06-06 SURGICAL SUPPLY — 17 items
BALLN SAPPHIRE 2.5X10 (BALLOONS) ×2
BALLN SAPPHIRE ~~LOC~~ 3.5X12 (BALLOONS) ×1 IMPLANT
BALLOON SAPPHIRE 2.5X10 (BALLOONS) IMPLANT
CATH 5FR JL3.5 JR4 ANG PIG MP (CATHETERS) ×1 IMPLANT
CATH VISTA GUIDE 6FR XBLAD3.5 (CATHETERS) ×1 IMPLANT
DEVICE RAD COMP TR BAND LRG (VASCULAR PRODUCTS) ×1 IMPLANT
GLIDESHEATH SLEND SS 6F .021 (SHEATH) ×1 IMPLANT
GUIDEWIRE INQWIRE 1.5J.035X260 (WIRE) IMPLANT
INQWIRE 1.5J .035X260CM (WIRE) ×2
KIT ENCORE 26 ADVANTAGE (KITS) ×1 IMPLANT
KIT HEART LEFT (KITS) ×2 IMPLANT
PACK CARDIAC CATHETERIZATION (CUSTOM PROCEDURE TRAY) ×2 IMPLANT
STENT SYNERGY XD 3.0X32 (Permanent Stent) ×1 IMPLANT
TRANSDUCER W/STOPCOCK (MISCELLANEOUS) ×2 IMPLANT
TUBING CIL FLEX 10 FLL-RA (TUBING) ×2 IMPLANT
WIRE HI TORQ BMW 190CM (WIRE) ×1 IMPLANT
WIRE RUNTHROUGH .014X180CM (WIRE) ×1 IMPLANT

## 2019-06-06 NOTE — Plan of Care (Signed)
  Problem: Nutrition: Goal: Adequate nutrition will be maintained Outcome: Completed/Met   Problem: Elimination: Goal: Will not experience complications related to urinary retention Outcome: Completed/Met

## 2019-06-06 NOTE — Progress Notes (Signed)
Pt stated when he got up to use BR he "noticed his HR went up", pt denied any CP/SOB or any other complaints. Upon telemetry review pts HR up to 122 ST. Strip saved. Will continue to monitor. Dierdre Highman, RN

## 2019-06-06 NOTE — Interval H&P Note (Signed)
History and Physical Interval Note:  06/06/2019 10:54 AM  Gabriel Jackson  has presented today for surgery, with the diagnosis of unstable angina.  The various methods of treatment have been discussed with the patient and family. After consideration of risks, benefits and other options for treatment, the patient has consented to  Procedure(s): LEFT HEART CATH AND CORONARY ANGIOGRAPHY (N/A) as a surgical intervention.  The patient's history has been reviewed, patient examined, no change in status, stable for surgery.  I have reviewed the patient's chart and labs.  Questions were answered to the patient's satisfaction.    Cath Lab Visit (complete for each Cath Lab visit)  Clinical Evaluation Leading to the Procedure:   ACS: Yes.    Non-ACS:  N/A  [name]

## 2019-06-06 NOTE — Progress Notes (Addendum)
Progress Note  Patient Name: Gabriel Jackson Date of Encounter: 06/06/2019  Primary Cardiologist: Fransico Him, MD   Subjective   Intermittent tachycardia with ambulation.  Denies chest pain or shortness of breath this morning.  Inpatient Medications    Scheduled Meds: . aspirin  81 mg Oral Daily  . atorvastatin  80 mg Oral q1800  . gabapentin  300 mg Oral QHS  . hydrochlorothiazide  25 mg Oral Daily  . irbesartan  300 mg Oral Daily  . metoprolol tartrate  12.5 mg Oral BID  . multivitamin with minerals  1 tablet Oral Daily  . pantoprazole  40 mg Oral Daily  . sodium chloride flush  3 mL Intravenous Q12H   Continuous Infusions: . sodium chloride    . sodium chloride 1 mL/kg/hr (06/06/19 0500)  . heparin 1,350 Units/hr (06/06/19 0714)   PRN Meds: sodium chloride, acetaminophen, nitroGLYCERIN, ondansetron (ZOFRAN) IV, sodium chloride flush   Vital Signs    Vitals:   06/05/19 2048 06/05/19 2103 06/06/19 0018 06/06/19 0334  BP: 113/77 139/81 (!) 124/92 (!) 153/86  Pulse: 67   67  Resp:    10  Temp: 98.5 F (36.9 C)   (!) 97.4 F (36.3 C)  TempSrc: Oral   Oral  SpO2: 97%   100%  Weight:    102.6 kg  Height:        Intake/Output Summary (Last 24 hours) at 06/06/2019 0801 Last data filed at 06/06/2019 0700 Gross per 24 hour  Intake 699.89 ml  Output 800 ml  Net -100.11 ml   Last 3 Weights 06/06/2019 06/05/2019 06/03/2019  Weight (lbs) 226 lb 3.2 oz 228 lb 11.2 oz 230 lb  Weight (kg) 102.604 kg 103.738 kg 104.327 kg      Telemetry    ST at 60s with intermittent tachycardia  - Personally Reviewed  ECG    No new tracing   Physical Exam   GEN: No acute distress.   Neck: No JVD Cardiac: RRR, no murmurs, rubs, or gallops.  Respiratory: Clear to auscultation bilaterally. GI: Soft, nontender, non-distended  MS: No edema; No deformity. Neuro:  Nonfocal  Psych: Normal affect   Labs    High Sensitivity Troponin:   Recent Labs  Lab 06/03/19 1508  06/03/19 1952  TROPONINIHS 9 8      Chemistry Recent Labs  Lab 06/03/19 1508 06/04/19 0840 06/06/19 0328  NA 142 141 142  K 3.5 3.5 3.9  CL 105 104 102  CO2 25 28 28   GLUCOSE 111* 107* 115*  BUN 19 15 15   CREATININE 1.28* 0.93 1.13  CALCIUM 9.8 9.3 9.2  GFRNONAA >60 >60 >60  GFRAA >60 >60 >60  ANIONGAP 12 9 12      Hematology Recent Labs  Lab 06/04/19 0840 06/05/19 0509 06/06/19 0328  WBC 6.6 8.9 10.9*  RBC 5.08 5.24 5.33  HGB 14.7 15.3 15.7  HCT 43.1 44.8 45.9  MCV 84.8 85.5 86.1  MCH 28.9 29.2 29.5  MCHC 34.1 34.2 34.2  RDW 12.8 13.1 12.8  PLT 178 193 215   Lipid Panel     Component Value Date/Time   CHOL 193 06/03/2019 1952   TRIG 66 06/03/2019 1952   HDL 40 (L) 06/03/2019 1952   CHOLHDL 4.8 06/03/2019 1952   VLDL 13 06/03/2019 1952   LDLCALC 140 (H) 06/03/2019 1952    BNPNo results for input(s): BNP, PROBNP in the last 168 hours.   DDimer No results for input(s): DDIMER in the last  168 hours.   Radiology    CT CORONARY MORPH W/CTA COR W/SCORE W/CA W/CM &/OR WO/CM  Result Date: 06/04/2019 CLINICAL DATA:  58M presents with chest pain EXAM: Cardiac/Coronary CTA TECHNIQUE: The patient was scanned on a [employer] scanner. FINDINGS: A 100 kV prospective scan was triggered in the descending thoracic aorta at 111 HU's. Axial non-contrast 3 mm slices were carried out through the heart. The data set was analyzed on a dedicated work station and scored using the [place] method. Gantry rotation speed was 250 msecs and collimation was .6 mm. No beta blockade and 0.8 mg of sl NTG was given. The 3D data set was reconstructed in 5% intervals of the 67-82 % of the R-R cycle. Diastolic phases were analyzed on a dedicated work station using MPR, MIP and VRT modes. The patient received 80 cc of contrast. Coronary Arteries:  Normal coronary origin.  Right dominance. RCA is a large dominant artery that gives rise to PDA and PLA. There is noncalcified plaque at the ostium of  the RCA causing mild (25-49%) stenosis Left main is a large artery that gives rise to LAD and LCX arteries. LAD is a large vessel. Calcified plaque in the proximal LAD causes minimal (0-24%) stenosis. Noncalcified plaque in the proximal LAD causes severe (70-99%) stenosis LCX is a non-dominant artery that gives rise to one large OM1 branch. There is no plaque. Other findings: Left Ventricle: Normal size Left Atrium: Mild enlargement Pulmonary Veins: Normal configuration Right Ventricle: Moderate dilatation Right Atrium: Mild enlargement Cardiac valves: No calcifications Thoracic aorta: Normal size Pulmonary Arteries: Normal size Systemic Veins: Normal drainage Pericardium: Normal thickness IMPRESSION: 1. Noncalcified plaque in the proximal LAD causes severe (70-99%) stenosis 2. Noncalcified plaque at the ostium of the RCA causes mild (25-49%) stenosis 3. Coronary calcium score of 17. This was 49th percentile for age and sex matched control. CAD-RADS 4 Severe stenosis. (70-99%). Cardiac catheterization is recommended. Consider symptom-guided anti-ischemic pharmacotherapy as well as risk factor modification per guideline directed care Electronically Signed   By: Christopher  Schumann MD   On: 06/04/2019 14:53    Cardiac Studies   Coronary CT 06/04/19 IMPRESSION: 1. Noncalcified plaque in the proximal LAD causes severe (70-99%) stenosis  2. Noncalcified plaque at the ostium of the RCA causes mild (25-49%) stenosis  3. Coronary calcium score of 17. This was 49th percentile for age and sex matched control.  CAD-RADS 4 Severe stenosis. (70-99%). Cardiac catheterization is recommended. Consider symptom-guided anti-ischemic pharmacotherapy as well as risk factor modification per guideline directed care  Patient Profile     58 y.o. male with history of hypertension, anxiety and GERD presented with symptoms concerning for progressive worsening angina.  Coronary CT showed LAD stenosis.  Plan cardiac  catheterization.   Assessment & Plan    1. Unstable agnina - Coronary CT abnormal with prox LAD 70-99% and RCA 25-49%. Troponin negative.  Continue aspirin, statin, beta-blocker, ARB and heparin.  Cardiac catheterization today. - 06/03/2019: Cholesterol 193; HDL 40; LDL Cholesterol 140; Triglycerides 66; VLDL 13  2.  Hypertension -Blood pressure labile due to nitroglycerin use yesterday. -Has tachycardia with walking -Increase Lopressor to 25 mg twice daily.  Reduce hydrochlorothiazide to 12.5 mg daily. -Continue ibesartan 300 mg daily  For questions or updates, please contact [facility] Please consult [email or web address] for contact info under      Signed, Bhavinkumar Bhagat, PA  06/06/2019, 8:01 AM    Patient seen and examined. Agree with assessment and plan.  Patient had   recurrent chest pain last evening with just walking to go to the bathroom.  Coronary CTA reviewed the data suggestive of high-grade stenosis with noncalcified plaque.  CT score is only 17 but suspect proximal greater than 70% LAD stenosis with 25 to less than 49% stenosis in the RCA.  The patient has had exertional chest pain symptomatology worrisome for angina pectoris.  I reviewed the CTA report with patient and family.  I discussed that despite a low calcium score symptoms are suggestive of progressive exertional angina due to noncalcified plaque.I have reviewed the risks, indications, and alternatives to cardiac catheterization, possible angioplasty, and stenting with the patient. Risks include but are not limited to bleeding, infection, vascular injury, stroke, myocardial infection, arrhythmia, kidney injury, radiation-related injury in the case of prolonged fluoroscopy use, emergency cardiac surgery, and death. The patient understands the risks of serious complication is 1-2 in 1000 with diagnostic cardiac cath and 1-2% or less with angioplasty/stenting.  Plan aggressive lipid-lowering therapy with target LDL less than 70.   Current LDL cholesterol 140.  Patient was started on atorvastatin 80 mg.  May need concomitant nitrate or amlodipine therapy with hypertension in addition of to his beta-blocker therapy.   Lennette Bihari, MD, [facility] 06/06/2019 9:21 AM

## 2019-06-06 NOTE — Progress Notes (Signed)
ANTICOAGULATION CONSULT NOTE - Follow-Up Consult  Pharmacy Consult for IV heparin Indication: CAD  Allergies  Allergen Reactions  . Proair Hfa [Albuterol] Hives  . Zithromax [Azithromycin] Hives    Patient Measurements: Height: 5\' 9"  (175.3 cm) Weight: 226 lb 3.2 oz (102.6 kg) IBW/kg (Calculated) : 70.7 Heparin Dosing Weight: 93 kg  Vital Signs: Temp: 97.4 F (36.3 C) (03/29 0334) Temp Source: Oral (03/29 0334) BP: 153/86 (03/29 0334) Pulse Rate: 67 (03/29 0334)  Labs: Recent Labs    06/03/19 1508 06/03/19 1508 06/03/19 1952 06/04/19 0840 06/04/19 2202 06/05/19 0509 06/05/19 1116 06/06/19 0328  HGB 15.8   < >  --  14.7  --  15.3  --  15.7  HCT 46.9   < >  --  43.1  --  44.8  --  45.9  PLT 231   < >  --  178  --  193  --  215  HEPARINUNFRC  --   --   --   --    < > 0.48 0.58 0.72*  CREATININE 1.28*  --   --  0.93  --   --   --  1.13  TROPONINIHS 9  --  8  --   --   --   --   --    < > = values in this interval not displayed.    Estimated Creatinine Clearance: 84.2 mL/min (by C-G formula based on SCr of 1.13 mg/dL).   Medical History: Past Medical History:  Diagnosis Date  . Anxiety   . GERD (gastroesophageal reflux disease)   . HTN (hypertension)   . OA (osteoarthritis)     Medications:  Infusions:  . sodium chloride    . sodium chloride 1 mL/kg/hr (06/06/19 0500)  . heparin 1,500 Units/hr (06/05/19 2326)    Assessment: 59 yo male admitted for chest pain, now with very tight LAD stenosis seen on coronary CT.  No known anticoagulation prior to admission.  Pharmacy asked to start IV heparin.  Planning cath on Monday.  Heparin level slightly above goal at 0.72, CBC stable, cath today.  Goal of Therapy:  Heparin level 0.3-0.7 units/ml Monitor platelets by anticoagulation protocol: Yes   Plan:  Reduce heparin to 1350 units/h Follow-up daily heparin level and CBC   Thursday, PharmD, BCPS Clinical Pharmacist [phone] Please check AMION  for all [facility] numbers 06/06/2019

## 2019-06-06 NOTE — Brief Op Note (Signed)
BRIEF CARDIAC CATHETERIZATION NOTE  06/06/2019  12:13 PM  PATIENT:  Gabriel Jackson  59 y.o. male  PRE-OPERATIVE DIAGNOSIS:  Unstable angina  POST-OPERATIVE DIAGNOSIS:  Unstable angina  PROCEDURE:  Procedure(s): LEFT HEART CATH AND CORONARY ANGIOGRAPHY (N/A)  SURGEON:  Surgeon(s) and Role:    Yvonne Kendall, MD - Primary  FINDINGS: 1. Significant single-vessel coronary artery disease, with sequential 95%, 60%, and 30% mid LAD stenoses. 2. Mild luminal irregularities noted in in the RCA and LCx. 3. Normal left ventricular filling pressure. 4. Successful PCI to sequential 95% and 60% mid LAD stenoses using Synergy 3.0 x 32 mm drug-eluting stent (post-dilated to 3.6 mm) with 0% residual stenosis and TIMI-3 flow.  Jailed D2 branch shows TIMI-3 flow after the intervention.  RECOMMENDATIONS: 1. DAPT with aspirin and prasugrel for at least 12 months. 2. Aggressive secondary prevention.  Yvonne Kendall, MD [facility]

## 2019-06-06 NOTE — H&P (View-Only) (Signed)
Progress Note  Patient Name: Gabriel Jackson Date of Encounter: 06/06/2019  Primary Cardiologist: Fransico Him, MD   Subjective   Intermittent tachycardia with ambulation.  Denies chest pain or shortness of breath this morning.  Inpatient Medications    Scheduled Meds: . aspirin  81 mg Oral Daily  . atorvastatin  80 mg Oral q1800  . gabapentin  300 mg Oral QHS  . hydrochlorothiazide  25 mg Oral Daily  . irbesartan  300 mg Oral Daily  . metoprolol tartrate  12.5 mg Oral BID  . multivitamin with minerals  1 tablet Oral Daily  . pantoprazole  40 mg Oral Daily  . sodium chloride flush  3 mL Intravenous Q12H   Continuous Infusions: . sodium chloride    . sodium chloride 1 mL/kg/hr (06/06/19 0500)  . heparin 1,350 Units/hr (06/06/19 0714)   PRN Meds: sodium chloride, acetaminophen, nitroGLYCERIN, ondansetron (ZOFRAN) IV, sodium chloride flush   Vital Signs    Vitals:   06/05/19 2048 06/05/19 2103 06/06/19 0018 06/06/19 0334  BP: 113/77 139/81 (!) 124/92 (!) 153/86  Pulse: 67   67  Resp:    10  Temp: 98.5 F (36.9 C)   (!) 97.4 F (36.3 C)  TempSrc: Oral   Oral  SpO2: 97%   100%  Weight:    102.6 kg  Height:        Intake/Output Summary (Last 24 hours) at 06/06/2019 0801 Last data filed at 06/06/2019 0700 Gross per 24 hour  Intake 699.89 ml  Output 800 ml  Net -100.11 ml   Last 3 Weights 06/06/2019 06/05/2019 06/03/2019  Weight (lbs) 226 lb 3.2 oz 228 lb 11.2 oz 230 lb  Weight (kg) 102.604 kg 103.738 kg 104.327 kg      Telemetry    ST at 60s with intermittent tachycardia  - Personally Reviewed  ECG    No new tracing   Physical Exam   GEN: No acute distress.   Neck: No JVD Cardiac: RRR, no murmurs, rubs, or gallops.  Respiratory: Clear to auscultation bilaterally. GI: Soft, nontender, non-distended  MS: No edema; No deformity. Neuro:  Nonfocal  Psych: Normal affect   Labs    High Sensitivity Troponin:   Recent Labs  Lab 06/03/19 1508  06/03/19 1952  TROPONINIHS 9 8      Chemistry Recent Labs  Lab 06/03/19 1508 06/04/19 0840 06/06/19 0328  NA 142 141 142  K 3.5 3.5 3.9  CL 105 104 102  CO2 25 28 28   GLUCOSE 111* 107* 115*  BUN 19 15 15   CREATININE 1.28* 0.93 1.13  CALCIUM 9.8 9.3 9.2  GFRNONAA >60 >60 >60  GFRAA >60 >60 >60  ANIONGAP 12 9 12      Hematology Recent Labs  Lab 06/04/19 0840 06/05/19 0509 06/06/19 0328  WBC 6.6 8.9 10.9*  RBC 5.08 5.24 5.33  HGB 14.7 15.3 15.7  HCT 43.1 44.8 45.9  MCV 84.8 85.5 86.1  MCH 28.9 29.2 29.5  MCHC 34.1 34.2 34.2  RDW 12.8 13.1 12.8  PLT 178 193 215   Lipid Panel     Component Value Date/Time   CHOL 193 06/03/2019 1952   TRIG 66 06/03/2019 1952   HDL 40 (L) 06/03/2019 1952   CHOLHDL 4.8 06/03/2019 1952   VLDL 13 06/03/2019 1952   LDLCALC 140 (H) 06/03/2019 1952    BNPNo results for input(s): BNP, PROBNP in the last 168 hours.   DDimer No results for input(s): DDIMER in the last  168 hours.   Radiology    CT CORONARY MORPH W/CTA COR W/SCORE W/CA W/CM &/OR WO/CM  Result Date: 06/04/2019 CLINICAL DATA:  48M presents with chest pain EXAM: Cardiac/Coronary CTA TECHNIQUE: The patient was scanned on a [employer]. FINDINGS: A 100 kV prospective scan was triggered in the descending thoracic aorta at 111 HU's. Axial non-contrast 3 mm slices were carried out through the heart. The data set was analyzed on a dedicated work station and scored using the Agatson method. Gantry rotation speed was 250 msecs and collimation was .6 mm. No beta blockade and 0.8 mg of sl NTG was given. The 3D data set was reconstructed in 5% intervals of the 67-82 % of the R-R cycle. Diastolic phases were analyzed on a dedicated work station using MPR, MIP and VRT modes. The patient received 80 cc of contrast. Coronary Arteries:  Normal coronary origin.  Right dominance. RCA is a large dominant artery that gives rise to PDA and PLA. There is noncalcified plaque at the ostium of  the RCA causing mild (25-49%) stenosis Left main is a large artery that gives rise to LAD and LCX arteries. LAD is a large vessel. Calcified plaque in the proximal LAD causes minimal (0-24%) stenosis. Noncalcified plaque in the proximal LAD causes severe (70-99%) stenosis LCX is a non-dominant artery that gives rise to one large OM1 branch. There is no plaque. Other findings: Left Ventricle: Normal size Left Atrium: Mild enlargement Pulmonary Veins: Normal configuration Right Ventricle: Moderate dilatation Right Atrium: Mild enlargement Cardiac valves: No calcifications Thoracic aorta: Normal size Pulmonary Arteries: Normal size Systemic Veins: Normal drainage Pericardium: Normal thickness IMPRESSION: 1. Noncalcified plaque in the proximal LAD causes severe (70-99%) stenosis 2. Noncalcified plaque at the ostium of the RCA causes mild (25-49%) stenosis 3. Coronary calcium score of 17. This was 49th percentile for age and sex matched control. CAD-RADS 4 Severe stenosis. (70-99%). Cardiac catheterization is recommended. Consider symptom-guided anti-ischemic pharmacotherapy as well as risk factor modification per guideline directed care Electronically Signed   By: Epifanio Lesches MD   On: 06/04/2019 14:53    Cardiac Studies   Coronary CT 06/04/19 IMPRESSION: 1. Noncalcified plaque in the proximal LAD causes severe (70-99%) stenosis  2. Noncalcified plaque at the ostium of the RCA causes mild (25-49%) stenosis  3. Coronary calcium score of 17. This was 49th percentile for age and sex matched control.  CAD-RADS 4 Severe stenosis. (70-99%). Cardiac catheterization is recommended. Consider symptom-guided anti-ischemic pharmacotherapy as well as risk factor modification per guideline directed care  Patient Profile     59 y.o. male with history of hypertension, anxiety and GERD presented with symptoms concerning for progressive worsening angina.  Coronary CT showed LAD stenosis.  Plan cardiac  catheterization.   Assessment & Plan    1. Unstable agnina - Coronary CT abnormal with prox LAD 70-99% and RCA 25-49%. Troponin negative.  Continue aspirin, statin, beta-blocker, ARB and heparin.  Cardiac catheterization today. - 06/03/2019: Cholesterol 193; HDL 40; LDL Cholesterol 140; Triglycerides 66; VLDL 13  2.  Hypertension -Blood pressure labile due to nitroglycerin use yesterday. -Has tachycardia with walking -Increase Lopressor to 25 mg twice daily.  Reduce hydrochlorothiazide to 12.5 mg daily. -Continue ibesartan 300 mg daily  For questions or updates, please contact [facility] Please consult [email or web address] for contact info under      SignedManson Passey, PA  06/06/2019, 8:01 AM    Patient seen and examined. Agree with assessment and plan.  Patient had  recurrent chest pain last evening with just walking to go to the bathroom.  Coronary CTA reviewed the data suggestive of high-grade stenosis with noncalcified plaque.  CT score is only 17 but suspect proximal greater than 70% LAD stenosis with 25 to less than 49% stenosis in the RCA.  The patient has had exertional chest pain symptomatology worrisome for angina pectoris.  I reviewed the CTA report with patient and family.  I discussed that despite a low calcium score symptoms are suggestive of progressive exertional angina due to noncalcified plaque.I have reviewed the risks, indications, and alternatives to cardiac catheterization, possible angioplasty, and stenting with the patient. Risks include but are not limited to bleeding, infection, vascular injury, stroke, myocardial infection, arrhythmia, kidney injury, radiation-related injury in the case of prolonged fluoroscopy use, emergency cardiac surgery, and death. The patient understands the risks of serious complication is 1-2 in 1000 with diagnostic cardiac cath and 1-2% or less with angioplasty/stenting.  Plan aggressive lipid-lowering therapy with target LDL less than 70.   Current LDL cholesterol 140.  Patient was started on atorvastatin 80 mg.  May need concomitant nitrate or amlodipine therapy with hypertension in addition of to his beta-blocker therapy.   Lennette Bihari, MD, [facility] 06/06/2019 9:21 AM

## 2019-06-07 ENCOUNTER — Encounter: Payer: Self-pay | Admitting: Cardiology

## 2019-06-07 ENCOUNTER — Telehealth: Payer: Self-pay | Admitting: Cardiology

## 2019-06-07 DIAGNOSIS — E785 Hyperlipidemia, unspecified: Secondary | ICD-10-CM | POA: Diagnosis not present

## 2019-06-07 DIAGNOSIS — I200 Unstable angina: Secondary | ICD-10-CM | POA: Diagnosis not present

## 2019-06-07 DIAGNOSIS — Z20822 Contact with and (suspected) exposure to covid-19: Secondary | ICD-10-CM | POA: Diagnosis not present

## 2019-06-07 DIAGNOSIS — I10 Essential (primary) hypertension: Secondary | ICD-10-CM | POA: Diagnosis not present

## 2019-06-07 DIAGNOSIS — I25110 Atherosclerotic heart disease of native coronary artery with unstable angina pectoris: Secondary | ICD-10-CM | POA: Diagnosis not present

## 2019-06-07 LAB — BASIC METABOLIC PANEL
Anion gap: 11 (ref 5–15)
BUN: 14 mg/dL (ref 6–20)
CO2: 27 mmol/L (ref 22–32)
Calcium: 9.2 mg/dL (ref 8.9–10.3)
Chloride: 104 mmol/L (ref 98–111)
Creatinine, Ser: 1.06 mg/dL (ref 0.61–1.24)
GFR calc Af Amer: >60 mL/min (ref >60)
GFR calc non Af Amer: >60 mL/min (ref >60)
Glucose, Bld: 105 mg/dL — ABNORMAL HIGH (ref 70–99)
Potassium: 3.6 mmol/L (ref 3.5–5.1)
Sodium: 142 mmol/L (ref 135–145)

## 2019-06-07 LAB — CBC
HCT: 44 % (ref 39.0–52.0)
Hemoglobin: 14.8 g/dL (ref 13.0–17.0)
MCH: 29 pg (ref 26.0–34.0)
MCHC: 33.6 g/dL (ref 30.0–36.0)
MCV: 86.1 fL (ref 80.0–100.0)
Platelets: 187 10*3/uL (ref 150–400)
RBC: 5.11 MIL/uL (ref 4.22–5.81)
RDW: 12.9 % (ref 11.5–15.5)
WBC: 8.2 10*3/uL (ref 4.0–10.5)
nRBC: 0 % (ref 0.0–0.2)

## 2019-06-07 MED ORDER — NITROGLYCERIN 0.4 MG SL SUBL: 0.4000 mg | SUBLINGUAL_TABLET | SUBLINGUAL | 3 refills | Status: DC | PRN

## 2019-06-07 MED ORDER — ASPIRIN EC 81 MG PO TBEC: 81.0000 mg | DELAYED_RELEASE_TABLET | Freq: Every day | ORAL | 11 refills | Status: DC

## 2019-06-07 MED ORDER — PRASUGREL HCL 10 MG PO TABS: 10.0000 mg | ORAL_TABLET | Freq: Every day | ORAL | 11 refills | Status: DC

## 2019-06-07 MED ORDER — METOPROLOL TARTRATE 25 MG PO TABS: 25.0000 mg | ORAL_TABLET | Freq: Two times a day (BID) | ORAL | 11 refills | Status: DC

## 2019-06-07 MED ORDER — ATORVASTATIN CALCIUM 80 MG PO TABS: 80.0000 mg | ORAL_TABLET | Freq: Every day | ORAL | 3 refills | Status: DC

## 2019-06-07 MED ORDER — OLMESARTAN MEDOXOMIL 40 MG PO TABS: 40.0000 mg | ORAL_TABLET | Freq: Every day | ORAL | 3 refills | Status: DC

## 2019-06-07 MED ORDER — AMLODIPINE BESYLATE 5 MG PO TABS: 5.0000 mg | ORAL_TABLET | Freq: Every day | ORAL | 3 refills | Status: DC

## 2019-06-07 NOTE — Care Management (Signed)
4709 06-07-19 Benefits Check submitted for prasugrel (EFFIENT) tablet 10 mg : Dose 10 mg : Oral : Daily. Case Manager will follow for cost. Graves-Bigelow, Lamar Laundry, RN,BSN Case Manager

## 2019-06-07 NOTE — TOC Benefit Eligibility Note (Signed)
Transition of Care [facility]) Benefit Eligibility Note    Patient Details  Name: Gabriel Jackson MRN: [id number] Date of Birth: 1960/12/28   Medication/Dose: PRASUGREL  10 MG DAILY  Covered?: Yes  Tier: (TIER- 1 DRUG)  Prescription Coverage Preferred Pharmacy: [place]  and   [facility]      90 DAY SUPPLY FOR M/O-$20.00  Spoke with Person/Company/Phone Number:: DONNA  @ [employer] [facility] RX # [phone]  Co-Pay: $ 10.00  Prior Approval: No  Deductible: (NO DEDUCTIBLE  and   OUT-OF-POCKET : NOT MET)       Memory Argue Phone Number: 06/07/2019, 10:54 AM

## 2019-06-07 NOTE — Discharge Summary (Addendum)
Discharge Summary    Patient ID: Gabriel Jackson MRN: [id number]; DOB: [birth year]  Admit date: 06/03/2019 Discharge date: 06/07/2019  Primary Care Provider: Daisy Floro, MD  Primary Cardiologist: Armanda Magic, MD   Discharge Diagnoses    Principal Problem:   Unstable angina [facility]) Active Problems:   Chest pain   Hypertension   Hyperlipidemia with target LDL less than 70  Intermittent sinus tachycardia   CAD  Diagnostic Studies/Procedures    CORONARY STENT INTERVENTION   06/06/19  LEFT HEART CATH AND CORONARY ANGIOGRAPHY  Conclusion  Conclusions: 1. Significant single-vessel coronary artery disease, with sequential 95%, 60%, and 30% mid LAD stenoses. 2. Mild, non-obstructive coronary artery disease involving the RCA. 3. Normal left ventricular filling pressure. 4. Successful PCI to sequential 95% and 60% mid LAD stenoses using Synergy 3.0 x 32 mm drug-eluting stent (post-dilated to 3.6 mm) with 0% residual stenosis and TIMI-3 flow. Jailed D2 branch shows TIMI-3 flow after the intervention.  Recommendations: 1. Dual antiplatelet therapy with aspirin and prasugrel for at least 12 months. 2. Aggressive secondary prevention.    Diagnostic Dominance: Right  Intervention     Coronary CT 06/04/19 IMPRESSION: 1. Noncalcified plaque in the proximal LAD causes severe (70-99%) stenosis  2. Noncalcified plaque at the ostium of the RCA causes mild (25-49%) stenosis  3. Coronary calcium score of 17. This was 49th percentile for age and sex matched control.  CAD-RADS 4 Severe stenosis. (70-99%). Cardiac catheterization is recommended. Consider symptom-guided anti-ischemic pharmacotherapy as well as risk factor modification per guideline directed care.    History of Present Illness     Gabriel Jackson is a 59 y.o. male with history of hypertension, anxiety and GERD presented with symptoms concerning for progressive worsening angina.  Patient has several month  history of progressive anginal-like symptoms.  He was scheduled for outpatient coronary CT however presented to ER with worsening symptoms.  Now occurring symptoms even with light activity.  Troponin normal. Pending outpatient monitor.   Hospital Course     Consultants: None  1. Unstable agnina - Coronary CT abnormal with prox LAD 70-99% and RCA 25-49%. Troponin negative.    Treated with heparin.  No recurrent symptoms.  Cath showed significant single-vessel coronary artery disease, with sequential 95%, 60%, and 30% mid LAD stenoses.  S/p successful PCI with DES of mid LAD.  Continue dual antiplatelet therapy with aspirin and Effient.  Continue beta-blocker, statin  and ARB.  2.  Hypertension -Relatively stable on ibesartan 300 mg and hydrochlorothiazide 25 mg daily.  - Increased metoprolol to 25 mg twice daily - Stopped HTCZ and added amlodipine at discharge. Advised close BP follow up as outpatient.   3.  Intermittent sinus tachycardia -Patient has intermittent tachycardia with ambulation up to 120s.  Improved after up titration of metoprolol. He will finished his monitor.   4.  Hyperlipidemia - 06/03/2019: Cholesterol 193; HDL 40; LDL Cholesterol 140; Triglycerides 66; VLDL 13  -Continue Lipitor 80 mg daily -Lipid panel and LFTs in 6 to 8 weeks  Did the patient have an acute coronary syndrome (MI, NSTEMI, STEMI, etc) this admission?:  Yes                               AHA/ACC Clinical Performance & Quality Measures: 1. Aspirin prescribed? - Yes 2. ADP Receptor Inhibitor (Plavix/Clopidogrel, Brilinta/Ticagrelor or Effient/Prasugrel) prescribed (includes medically managed patients)? - Yes 3. Beta Blocker prescribed? - Yes 4.  High Intensity Statin (Lipitor 40-80mg  or Crestor 20-40mg ) prescribed? - Yes 5. EF assessed during THIS hospitalization? - Yes 6. For EF <40%, was ACEI/ARB prescribed? - Yes 7. For EF <40%, Aldosterone Antagonist (Spironolactone or Eplerenone) prescribed? - Not  Applicable (EF >/= 40%) 8. Cardiac Rehab Phase II ordered (Included Medically managed Patients)? - Yes   _____________  Discharge Vitals Blood pressure (!) 141/91, pulse 70, temperature 97.6 F (36.4 C), temperature source Oral, resp. rate 16, height 5\' 9"  (1.753 m), weight 103 kg, SpO2 100 %.  Filed Weights   06/05/19 0419 06/06/19 0334 06/07/19 0334  Weight: 103.7 kg 102.6 kg 103 kg   Physical Exam  Constitutional: He is oriented to person, place, and time and well-developed, well-nourished, and in no distress.  HENT:  Head: Normocephalic and atraumatic.  Eyes: Pupils are equal, round, and reactive to light. EOM are normal.  Cardiovascular: Normal rate and regular rhythm.  Radial cath site without hematoma  Pulmonary/Chest: Effort normal and breath sounds normal.  Abdominal: Soft. Bowel sounds are normal.  Musculoskeletal:        General: Normal range of motion.     Cervical back: Normal range of motion and neck supple.  Neurological: He is alert and oriented to person, place, and time.  Skin: Skin is warm and dry.  Psychiatric: Affect normal.    Labs & Radiologic Studies    CBC Recent Labs    06/06/19 0328 06/07/19 0409  WBC 10.9* 8.2  HGB 15.7 14.8  HCT 45.9 44.0  MCV 86.1 86.1  PLT 215 187   Basic Metabolic Panel Recent Labs    06/09/19 0328 06/07/19 0409  NA 142 142  K 3.9 3.6  CL 102 104  CO2 28 27  GLUCOSE 115* 105*  BUN 15 14  CREATININE 1.13 1.06  CALCIUM 9.2 9.2   _____________  DG Chest 2 View  Result Date: 06/03/2019 CLINICAL DATA:  Chest tightness and shortness of breath. EXAM: CHEST - 2 VIEW COMPARISON:  October 11, 2009 FINDINGS: The heart size and mediastinal contours are within normal limits. Both lungs are clear. The visualized skeletal structures are unremarkable. IMPRESSION: No active cardiopulmonary disease. Electronically Signed   By: October 13, 2009 M.D.   On: 06/03/2019 15:17   CARDIAC CATHETERIZATION  Result Date:  06/06/2019 Conclusions: 1. Significant single-vessel coronary artery disease, with sequential 95%, 60%, and 30% mid LAD stenoses. 2. Mild, non-obstructive coronary artery disease involving the RCA. 3. Normal left ventricular filling pressure. 4. Successful PCI to sequential 95% and 60% mid LAD stenoses using Synergy 3.0 x 32 mm drug-eluting stent (post-dilated to 3.6 mm) with 0% residual stenosis and TIMI-3 flow.  Jailed D2 branch shows TIMI-3 flow after the intervention.  Recommendations: 1. Dual antiplatelet therapy with aspirin and prasugrel for at least 12 months. 2. Aggressive secondary prevention. 06/08/2019, MD [facility]  CT CORONARY [facility] W/CTA COR W/SCORE [facility] W/CM &/OR WO/CM  Addendum Date: 06/06/2019   ADDENDUM REPORT: 06/06/2019 08:40 EXAM: OVER-READ INTERPRETATION  CT CHEST The following report is an over-read performed by radiologist Dr. 06/08/2019 [facility] on 06/06/2019. This over-read does not include interpretation of cardiac or coronary anatomy or pathology. The coronary CTA interpretation by the cardiologist is attached. COMPARISON:  None. FINDINGS: Cardiovascular: Heart size normal.  No pericardial effusion. Mediastinum/nodes: No mass or adenopathy.  Small hiatal hernia. Lungs/pleura: No pleural effusion identified. No airspace consolidation, atelectasis or pneumothorax identified. Upper abdomen: No acute abnormality. Musculoskeletal: No acute or significant osseous  findings identified. IMPRESSION: 1. Negative over read. Electronically Signed   By: Signa Kell M.D.   On: 06/06/2019 08:40   Result Date: 06/06/2019 CLINICAL DATA:  62M presents with chest pain EXAM: Cardiac/Coronary CTA TECHNIQUE: The patient was scanned on a [employer]. FINDINGS: A 100 kV prospective scan was triggered in the descending thoracic aorta at 111 HU's. Axial non-contrast 3 mm slices were carried out through the heart. The data set was analyzed on a dedicated work  station and scored using the Agatson method. Gantry rotation speed was 250 msecs and collimation was .6 mm. No beta blockade and 0.8 mg of sl NTG was given. The 3D data set was reconstructed in 5% intervals of the 67-82 % of the R-R cycle. Diastolic phases were analyzed on a dedicated work station using MPR, MIP and VRT modes. The patient received 80 cc of contrast. Coronary Arteries:  Normal coronary origin.  Right dominance. RCA is a large dominant artery that gives rise to PDA and PLA. There is noncalcified plaque at the ostium of the RCA causing mild (25-49%) stenosis Left main is a large artery that gives rise to LAD and LCX arteries. LAD is a large vessel. Calcified plaque in the proximal LAD causes minimal (0-24%) stenosis. Noncalcified plaque in the proximal LAD causes severe (70-99%) stenosis LCX is a non-dominant artery that gives rise to one large OM1 branch. There is no plaque. Other findings: Left Ventricle: Normal size Left Atrium: Mild enlargement Pulmonary Veins: Normal configuration Right Ventricle: Moderate dilatation Right Atrium: Mild enlargement Cardiac valves: No calcifications Thoracic aorta: Normal size Pulmonary Arteries: Normal size Systemic Veins: Normal drainage Pericardium: Normal thickness IMPRESSION: 1. Noncalcified plaque in the proximal LAD causes severe (70-99%) stenosis 2. Noncalcified plaque at the ostium of the RCA causes mild (25-49%) stenosis 3. Coronary calcium score of 17. This was 49th percentile for age and sex matched control. CAD-RADS 4 Severe stenosis. (70-99%). Cardiac catheterization is recommended. Consider symptom-guided anti-ischemic pharmacotherapy as well as risk factor modification per guideline directed care Electronically Signed: By: Epifanio Lesches MD On: 06/04/2019 14:53   ECHOCARDIOGRAM COMPLETE  Result Date: 05/16/2019    ECHOCARDIOGRAM REPORT   Patient Name:   EDER MACEK Date of Exam: 05/16/2019 Medical Rec #:  [id number]       Height:        72.0 in Accession #:    [id number]      Weight:       238.2 lb Date of Birth:  03/06/1961       BSA:          2.295 m Patient Age:    58 years        BP:           174/104 mmHg Patient Gender: M               HR:           74 bpm. Exam Location:  [address] Procedure: 2D Echo, Cardiac Doppler and Color Doppler Indications:    R07.9 Chest Pain  History:        Patient has no prior history of Echocardiogram examinations.                 Arrythmias:Palpitations; Risk Factors:Hypertension.  Sonographer:    Clearence Ped RCS Referring Phys: [phone] TRACI R TURNER IMPRESSIONS  1. Left ventricular ejection fraction, by estimation, is 60 to 65%. The left ventricle has normal function. The left ventricle has no  regional wall motion abnormalities. There is mild asymmetric left ventricular hypertrophy of the basal-septal segment. Left ventricular diastolic parameters were normal.  2. Right ventricular systolic function is normal. The right ventricular size is normal. There is normal pulmonary artery systolic pressure.  3. The mitral valve is normal in structure. Trivial mitral valve regurgitation. No evidence of mitral stenosis.  4. The aortic valve is tricuspid. Aortic valve regurgitation is not visualized. No aortic stenosis is present.  5. The inferior vena cava is normal in size with greater than 50% respiratory variability, suggesting right atrial pressure of 3 mmHg. Comparison(s): No prior Echocardiogram. FINDINGS  Left Ventricle: Left ventricular ejection fraction, by estimation, is 60 to 65%. The left ventricle has normal function. The left ventricle has no regional wall motion abnormalities. The left ventricular internal cavity size was normal in size. There is  mild asymmetric left ventricular hypertrophy of the basal-septal segment. Left ventricular diastolic parameters were normal. Right Ventricle: The right ventricular size is normal. No increase in right ventricular wall thickness. Right ventricular systolic  function is normal. There is normal pulmonary artery systolic pressure. The tricuspid regurgitant velocity is 2.04 m/s, and  with an assumed right atrial pressure of 3 mmHg, the estimated right ventricular systolic pressure is 19.6 mmHg. Left Atrium: Left atrial size was normal in size. Right Atrium: Right atrial size was normal in size. Pericardium: There is no evidence of pericardial effusion. Mitral Valve: The mitral valve is normal in structure. Trivial mitral valve regurgitation. No evidence of mitral valve stenosis. Tricuspid Valve: The tricuspid valve is normal in structure. Tricuspid valve regurgitation is trivial. No evidence of tricuspid stenosis. Aortic Valve: The aortic valve is tricuspid. Aortic valve regurgitation is not visualized. No aortic stenosis is present. Pulmonic Valve: The pulmonic valve was grossly normal. Pulmonic valve regurgitation is not visualized. No evidence of pulmonic stenosis. Aorta: The aortic root, ascending aorta and aortic arch are all structurally normal, with no evidence of dilitation or obstruction. Pulmonary Artery: The pulmonary artery is of normal size. Venous: The inferior vena cava is normal in size with greater than 50% respiratory variability, suggesting right atrial pressure of 3 mmHg. IAS/Shunts: No atrial level shunt detected by color flow Doppler.  LEFT VENTRICLE PLAX 2D LVIDd:         4.16 cm  Diastology LVIDs:         2.64 cm  LV e' lateral:   8.49 cm/s LV PW:         1.16 cm  LV E/e' lateral: 8.5 LV IVS:        1.38 cm  LV e' medial:    7.83 cm/s LVOT diam:     2.10 cm  LV E/e' medial:  9.3 LV SV:         82 LV SV Index:   36       2D Longitudinal Strain LVOT Area:     3.46 cm 2D Strain GLS (A2C):   -19.7 %                         2D Strain GLS (A3C):   -21.0 %                         2D Strain GLS (A4C):   -17.8 %                         2D Strain  GLS Avg:     -19.4 % RIGHT VENTRICLE RV Basal diam:  4.11 cm RV S prime:     13.30 cm/s TAPSE (M-mode): 1.9 cm  RVSP:           19.6 mmHg LEFT ATRIUM             Index       RIGHT ATRIUM           Index LA diam:        3.60 cm 1.57 cm/m  RA Pressure: 3.00 mmHg LA Vol (A2C):   52.9 ml 23.05 ml/m RA Area:     11.60 cm LA Vol (A4C):   28.7 ml 12.51 ml/m RA Volume:   18.80 ml  8.19 ml/m LA Biplane Vol: 41.8 ml 18.21 ml/m  AORTIC VALVE LVOT Vmax:   116.00 cm/s LVOT Vmean:  81.300 cm/s LVOT VTI:    0.237 m  AORTA Ao Root diam: 3.20 cm MITRAL VALVE               TRICUSPID VALVE MV Area (PHT):             TR Peak grad:   16.6 mmHg MV Decel Time:             TR Vmax:        204.00 cm/s MV E velocity: 72.50 cm/s  Estimated RAP:  3.00 mmHg MV A velocity: 72.50 cm/s  RVSP:           19.6 mmHg MV E/A ratio:  1.00                            SHUNTS                            Systemic VTI:  0.24 m                            Systemic Diam: 2.10 cm Buford Dresser MD Electronically signed by Buford Dresser MD Signature Date/Time: 05/16/2019/12:18:06 PM    Final    Disposition   Pt is being discharged home today in good condition.  Follow-up Plans & Appointments    Follow-up Information    Liliane Shi, PA-C. Go on 06/21/2019.   Specialties: Cardiology, Physician Assistant Why: @11 :15 for hospital follow up with Dr. Theodosia Blender PA Contact information: [address] [place] [address] [phone]          Discharge Instructions    Amb Referral to Cardiac Rehabilitation   Complete by: As directed    Diagnosis: Coronary Stents   After initial evaluation and assessments completed: Virtual Based Care may be provided alone or in conjunction with Phase 2 Cardiac Rehab based on patient barriers.: Yes   Diet - low sodium heart healthy   Complete by: As directed    Discharge instructions   Complete by: As directed    No driving for 48 hours.  No lifting over 5 lbs for 1 week. No sexual activity for 1 week. You may return to work on 06/13/19. Keep procedure site clean & dry. If you notice  increased pain, swelling, bleeding or pus, call/return!  You may shower, but no soaking baths/hot tubs/pools for 1 week.   Increase activity slowly   Complete by: As directed       Discharge Medications  Allergies as of 06/07/2019      Reactions   Proair Hfa [albuterol] Hives   Zithromax [azithromycin] Hives      Medication List    STOP taking these medications   olmesartan-hydrochlorothiazide 40-25 MG tablet Commonly known as: BENICAR HCT   pantoprazole 40 MG tablet Commonly known as: PROTONIX     TAKE these medications   amLODipine 5 MG tablet Commonly known as: NORVASC Take 1 tablet (5 mg total) by mouth daily.   aspirin EC 81 MG tablet Take 1 tablet (81 mg total) by mouth daily.   atorvastatin 80 MG tablet Commonly known as: LIPITOR Take 1 tablet (80 mg total) by mouth daily at 6 PM.   EMERGEN-C IMMUNE PO Take 1 tablet by mouth daily.   gabapentin 300 MG capsule Commonly known as: NEURONTIN Take 300 mg by mouth at bedtime.   GLUCOSAMINE PO Take 2 tablets by mouth daily.   metoprolol tartrate 25 MG tablet Commonly known as: LOPRESSOR Take 1 tablet (25 mg total) by mouth 2 (two) times daily.   multivitamin capsule Take 1 capsule by mouth daily.   nitroGLYCERIN 0.4 MG SL tablet Commonly known as: NITROSTAT Place 1 tablet (0.4 mg total) under the tongue every 5 (five) minutes x 3 doses as needed for chest pain.   olmesartan 40 MG tablet Commonly known as: BENICAR Take 1 tablet (40 mg total) by mouth daily.   omeprazole 20 MG capsule Commonly known as: PRILOSEC Take 20 mg by mouth daily.   prasugrel 10 MG Tabs tablet Commonly known as: EFFIENT Take 1 tablet (10 mg total) by mouth daily. Start taking on: June 08, 2019   tadalafil 20 MG tablet Commonly known as: CIALIS Take 20 mg by mouth daily as needed for erectile dysfunction.          Outstanding Labs/Studies   Consider OP f/u labs 6-8 weeks given statin initiation this  admission.  Duration of Discharge Encounter   Greater than 30 minutes including physician time.  Vonzella Nipple [place], PA 06/07/2019, 10:59 AM    Patient seen and examined. Agree with assessment and plan.  No recurrent chest pain.  Angiographic images reviewed; status post successful stenting to tandem LAD lesions.  There is additional LAD lesion beyond the stented segment.  Nonobstructive disease is found in the RCA with narrowings of 30%.  Blood pressure presently continues to be elevated. Will change ARB therapy back to his previous olmesartan change to 40 mg daily rather than combined HCT regimen.. Recommend adding amlodipine 5 mg to his regimen for more optimal blood pressure control as well as potential anti-ischemic benefit.  Now on high potency statin therapy.  He is now on beta-blocker therapy.  The patient had been wearing a 30-day event monitor for approximately 2-1/2 weeks.  He will resume wearing his monitor as an outpatient until complete.  Ambulate.  Plan discharge today with follow-up with Dr. Mayford Knife.   Lennette Bihari, MD, [facility] 06/07/2019 10:59 AM

## 2019-06-07 NOTE — Progress Notes (Signed)
CARDIAC REHAB PHASE I   PRE:  Rate/Rhythm: 73 SR  BP:  Sitting: 137/89      SaO2: 98 RA  MODE:  Ambulation: 470 ft   POST:  Rate/Rhythm: 98 SR  BP:  Sitting: 161/98    SaO2: 99 RA  Pt ambulated 433ft independently with steady gait. Pt denies CP or SOB. Pt educated on importance of ASA, Effient, statin, and NTG. Pt with stent card at bedside, given heart healthy diet. Reviewed restrictions, site care, and exercise guidelines. Will refer to CRP II [place]. Pt is interested in participating in Virtual Cardiac and Pulmonary Rehab. Pt advised that Virtual Cardiac and Pulmonary Rehab is provided at no cost to the patient.  Checklist:  1. Pt has smart device  ie smartphone and/or ipad for downloading an app  Yes 2. Reliable internet/wifi service    Yes 3. Understands how to use their smartphone and navigate within an app.  Yes  Pt verbalized understanding and is in agreement.Marland Kitchen  [phone] Reynold Bowen, RN BSN 06/07/2019 10:13 AM

## 2019-06-07 NOTE — Telephone Encounter (Signed)
Patient has TOC appointment with Tereso Newcomer 06/21/2019 at 11:15am per Vin.

## 2019-06-08 NOTE — Telephone Encounter (Signed)
**Note De-Identified Gabriel Jackson Obfuscation** 1st TCM Call attempt: No answer so I left a detailed mess (ok per DPR) asking the pt to call me back and advising that this is a TCM call and that I will be calling him back tomorrow if he has not returned my call.  I attempted to reach his wife (DPR) at the number listed in the pts DPR but her VM is full.

## 2019-06-09 ENCOUNTER — Telehealth (HOSPITAL_COMMUNITY): Payer: Self-pay

## 2019-06-09 NOTE — Telephone Encounter (Signed)
Called patient to see if he is interested in the Cardiac Rehab Program. Patient expressed interest. Explained scheduling process and went over insurance, patient verbalized understanding. Will contact patient for scheduling once f/u has been completed.  °

## 2019-06-09 NOTE — Telephone Encounter (Signed)
Pt insurance is active and benefits verified through [facility]. Co-pay $0.00, DED $800.00/$800.00 met, out of pocket $5,000.00/$1,785.14 met, co-insurance 20%. No pre-authorization required. Passport, 06/09/19 @ 3:56PM, SOR#[id number]  Will contact patient to see if he is interested in the Cardiac Rehab Program. If interested, patient will need to complete follow up appt. Once completed, patient will be contacted for scheduling upon review by the RN Navigator.

## 2019-06-10 ENCOUNTER — Other Ambulatory Visit (HOSPITAL_COMMUNITY): Payer: BC Managed Care – PPO

## 2019-06-10 NOTE — Telephone Encounter (Signed)
**Note De-Identified Gabriel Jackson Obfuscation** Patient contacted regarding discharge from [facility] on 06/07/2019.  Patient understands to follow up with provider Tereso Newcomer, PA-c on 06/21/2019 at 11:15 at [address] in [place], [place] [address]. Patient understands discharge instructions? Yes Patient understands medications and regiment? Yes Patient understands to bring all medications to this visit? Yes

## 2019-06-20 NOTE — Progress Notes (Signed)
Cardiology Office Note:    Date:  06/21/2019   ID:  Gabriel Jackson, DOB 09-Mar-1961, MRN [id number]  PCP:  Daisy Floro, MD  Cardiologist:  Armanda Magic, MD  Electrophysiologist:  None   Referring MD: Daisy Floro, MD   Chief Complaint:  Hospitalization Follow-up ([place], s/p DES to LAD)    Patient Profile:    Gabriel Jackson is a 59 y.o. male with:   Coronary artery disease   [place] 05/2019:  S/p DES to LAD (jailed D2 branch)  Echocardiogram 3/21: EF 60-65  GERD  Hypertension   Anxiety   Prior CV studies: Cardiac catheterization Jun 17, 2019 LAD mid 95, 60, 30 RCA prox 30, mid 30 PCI:  3 x 32 mm Synergy DES to mLAD, jailed D2 branch  Echocardiogram 05/16/19 EF 60-65, no RWMA, mild asymmetric LVH, normal RVSF, normal RVSP, trivial MR  History of Present Illness:    Gabriel Jackson was evaluated by Dr. Mayford Knife 05/13/19 for chest pain and palpitations.  He was set up for a coronary CTA and event monitor.  However, he had worsening chest pain and was admitted 06/03/19-06/07/19.  CEs remained neg.  Cardiac catheterization demonstrated severe LAD stenosis which was tx with a DES.  He had intermittent sinus tachycardia in the hospital and his beta-blocker dose was increased.  He was encouraged to finish his event monitor.  Of note, his HCTZ was stopped and he was placed on Amlodipine.  He returns for post hospital follow up.  This is a transitional care appointment.  He is here alone.  He had some epigastric discomfort a few times after discharge.  This is resolved.  He has not had any substernal chest discomfort, pressure or tightness.  He has not had significant shortness of breath.  He has not had syncope, near syncope, orthopnea, lower extremity swelling.  Past Medical History:  Diagnosis Date  . Anxiety   . CAD (coronary artery disease)    Cath Jun 17, 2019 (admx w [place]) >> mLAD 95, 60, 30; pRCA 30, mRCA 30 >> PCI:  DES to mLAD, D2 jailed by stent // Echo 05/2019: EF 60-65, mild  asymmetric LVH, trivial MR, normal RVSf   . GERD (gastroesophageal reflux disease)   . HTN (hypertension)   . Hyperlipidemia   . OA (osteoarthritis)     Current Medications: Current Meds  Medication Sig  . amLODipine (NORVASC) 5 MG tablet Take 1 tablet (5 mg total) by mouth daily.  Marland Kitchen aspirin EC 81 MG tablet Take 1 tablet (81 mg total) by mouth daily.  Marland Kitchen atorvastatin (LIPITOR) 80 MG tablet Take 1 tablet (80 mg total) by mouth daily at 6 PM.  . gabapentin (NEURONTIN) 300 MG capsule Take 300 mg by mouth at bedtime.  . Glucosamine HCl (GLUCOSAMINE PO) Take 2 tablets by mouth daily.   . metoprolol tartrate (LOPRESSOR) 25 MG tablet Take 1 tablet (25 mg total) by mouth 2 (two) times daily.  . Multiple Vitamin (MULTIVITAMIN) capsule Take 1 capsule by mouth daily.  . Multiple Vitamins-Minerals (EMERGEN-C IMMUNE PO) Take 1 tablet by mouth daily.  . nitroGLYCERIN (NITROSTAT) 0.4 MG SL tablet Place 1 tablet (0.4 mg total) under the tongue every 5 (five) minutes x 3 doses as needed for chest pain.  Marland Kitchen olmesartan (BENICAR) 40 MG tablet Take 1 tablet (40 mg total) by mouth daily.  Marland Kitchen omeprazole (PRILOSEC) 20 MG capsule Take 20 mg by mouth daily.  . prasugrel (EFFIENT) 10 MG TABS tablet Take 1 tablet (10 mg total)  by mouth daily.  . tadalafil (CIALIS) 20 MG tablet Take 20 mg by mouth daily as needed for erectile dysfunction.     Allergies:   Proair hfa [albuterol] and Zithromax [azithromycin]   Social History   Tobacco Use  . Smoking status: Never Smoker  . Smokeless tobacco: Never Used  Substance Use Topics  . Alcohol use: Yes  . Drug use: No     Family Hx: The patient's family history includes Diabetes in his father; Healthy in his brother.  ROS   EKGs/Labs/Other Test Reviewed:    EKG:  EKG is  ordered today.  The ekg ordered today demonstrates normal sinus rhythm, heart rate 67, normal axis, no ST-T wave changes, QTC 395, no change from prior tracing  Recent Labs: 06/07/2019: BUN 14;  Creatinine, Ser 1.06; Hemoglobin 14.8; Platelets 187; Potassium 3.6; Sodium 142   Recent Lipid Panel Lab Results  Component Value Date/Time   CHOL 193 06/03/2019 07:52 PM   TRIG 66 06/03/2019 07:52 PM   HDL 40 (L) 06/03/2019 07:52 PM   CHOLHDL 4.8 06/03/2019 07:52 PM   LDLCALC 140 (H) 06/03/2019 07:52 PM    Physical Exam:    VS:  BP 110/70   Pulse 67   Ht 5\' 9"  (1.753 m)   Wt 229 lb (103.9 kg)   SpO2 96%   BMI 33.82 kg/m     Wt Readings from Last 3 Encounters:  06/21/19 229 lb (103.9 kg)  06/07/19 227 lb (103 kg)  05/13/19 238 lb 3.2 oz (108 kg)     Constitutional:      Appearance: Healthy appearance. Not in distress.  Neck:     Thyroid: No thyromegaly.     Vascular: JVD normal.  Pulmonary:     Effort: Pulmonary effort is normal.     Breath sounds: No wheezing. No rales.  Cardiovascular:     Normal rate. Regular rhythm. Normal S1. Normal S2.     Murmurs: There is no murmur.  Edema:    Peripheral edema absent.  Abdominal:     Palpations: Abdomen is soft. There is no hepatomegaly.  Musculoskeletal:     Comments: R wrist without hematoma Skin:    General: Skin is warm and dry.  Neurological:     General: No focal deficit present.     Mental Status: Alert and oriented to person, place and time.     Cranial Nerves: Cranial nerves are intact.      ASSESSMENT & PLAN:    1. Coronary artery disease involving native coronary artery of native heart without angina pectoris Status post recent DES to the LAD.  He is doing well without anginal symptoms.  Continue aspirin, prasugrel, atorvastatin, metoprolol tartrate.  We discussed the importance of uninterrupted dual antiplatelet Rx for 12 mos post PCI with a DES.  I have encouraged him to pursue cardiac rehabilitation.  Follow-up with Dr. 07/13/19 in 3 months.  2. Hyperlipidemia with target LDL less than 70 Continue high intensity statin therapy with atorvastatin 80 mg daily.  Arrange fasting lipids, LFTs in 8 weeks.  3.  Essential hypertension The patient's blood pressure is controlled on his current regimen.  Continue current therapy.   4.  Palpitations He has completed his monitor.  The report is still pending.      Dispo:  Return in about 3 months (around 09/20/2019) for Routine Follow Up w/ Dr. 09/22/2019, in person.   Medication Adjustments/Labs and Tests Ordered: Current medicines are reviewed at length with the  patient today.  Concerns regarding medicines are outlined above.  Tests Ordered: Orders Placed This Encounter  Procedures  . Lipid panel  . Hepatic function panel  . EKG 12-Lead   Medication Changes: No orders of the defined types were placed in this encounter.   Signed, Richardson Dopp, PA-C  06/21/2019 12:17 PM    [facility] [address], [place], [place]  [address] Phone: [phone]; Fax: [phone]

## 2019-06-21 ENCOUNTER — Other Ambulatory Visit: Payer: Self-pay

## 2019-06-21 ENCOUNTER — Ambulatory Visit (INDEPENDENT_AMBULATORY_CARE_PROVIDER_SITE_OTHER): Payer: BC Managed Care – PPO | Admitting: Physician Assistant

## 2019-06-21 ENCOUNTER — Encounter: Payer: Self-pay | Admitting: Physician Assistant

## 2019-06-21 VITALS — BP 110/70 | HR 67 | Ht 69.0 in | Wt 229.0 lb

## 2019-06-21 DIAGNOSIS — I2510 Atherosclerotic heart disease of native coronary artery without angina pectoris: Secondary | ICD-10-CM

## 2019-06-21 DIAGNOSIS — R002 Palpitations: Secondary | ICD-10-CM | POA: Diagnosis not present

## 2019-06-21 DIAGNOSIS — I10 Essential (primary) hypertension: Secondary | ICD-10-CM | POA: Diagnosis not present

## 2019-06-21 DIAGNOSIS — Z961 Presence of intraocular lens: Secondary | ICD-10-CM | POA: Diagnosis not present

## 2019-06-21 DIAGNOSIS — E785 Hyperlipidemia, unspecified: Secondary | ICD-10-CM

## 2019-06-21 NOTE — Patient Instructions (Signed)
Medication Instructions:   Your physician recommends that you continue on your current medications as directed. Please refer to the Current Medication list given to you today.  *If you need a refill on your cardiac medications before your next appointment, please call your pharmacy*  Lab Work:  Your physician recommends that you return for lab work in 8 weeks on 08/16/19  If you have labs (blood work) drawn today and your tests are completely normal, you will receive your results only by: Marland Kitchen MyChart Message (if you have MyChart) OR . A paper copy in the mail If you have any lab test that is abnormal or we need to change your treatment, we will call you to review the results.  Testing/Procedures:  None ordered today  Follow-Up: At [facility], you and your health needs are our priority.  As part of our continuing mission to provide you with exceptional heart care, we have created designated Provider Care Teams.  These Care Teams include your primary Cardiologist (physician) and Advanced Practice Providers (APPs -  Physician Assistants and Nurse Practitioners) who all work together to provide you with the care you need, when you need it.  We recommend signing up for the patient portal called "MyChart".  Sign up information is provided on this After Visit Summary.  MyChart is used to connect with patients for Virtual Visits (Telemedicine).  Patients are able to view lab/test results, encounter notes, upcoming appointments, etc.  Non-urgent messages can be sent to your provider as well.   To learn more about what you can do with MyChart, go to [email or web address].    Your next appointment:   3 month(s)  The format for your next appointment:   In Person  Provider:   Armanda Magic, MD

## 2019-06-22 ENCOUNTER — Telehealth (HOSPITAL_COMMUNITY): Payer: Self-pay

## 2019-06-22 NOTE — Telephone Encounter (Signed)
Called patient to see if he was interested in participating in the Cardiac Rehab Program. Patient stated yes. Patient will come in for orientation on 07/12/2019@9 :00am and will attend the 11:00am exercise class.  Mailed homework package.

## 2019-07-05 ENCOUNTER — Telehealth (HOSPITAL_COMMUNITY): Payer: Self-pay | Admitting: Pharmacist

## 2019-07-06 NOTE — Telephone Encounter (Signed)
Cardiac Rehab Medication Review by a Pharmacist  Does the patient  feel that his/her medications are working for him/her?  Yes - patient feels that they are working for him and feeling pretty good.  Has the patient been experiencing any side effects to the medications prescribed?  no  Does the patient measure his/her own blood pressure or blood glucose at home?  yes but has not checked in a while. Last BP check was running between 125-130s/80. Patient has been unable to check recently due to wife being in hospital.  Does the patient have any problems obtaining medications due to transportation or finances?   No issues right now but patient is working on patient assistance program.  Understanding of regimen: excellent Understanding of indications: good Potential of compliance: excellent - pt only missed 1 dose of medication in the last 30 days.   Pharmacist comments: Patient is currently on nitroglycerin SL tablets PRN and Cialis. There is a major drug-drug interaction between these two agents, including risk of fatal hypotension. Fortunately patient has not had to use SL nitroglycerin after hospitalization. Last time Mr. Mirsky took the nitroglycerin, he was in the hospital and experienced hypotension that required IV fluids. I explained to Mr. Lowe details about this interaction and he understands the risk of taking these two medications together. He will follow up with cardiologist regarding future plan about taking these medications prior to taking future doses.   Alvia Grove, PharmD PGY1 Acute Care Pharmacy Resident 07/06/2019 4:48 PM

## 2019-07-08 ENCOUNTER — Telehealth (HOSPITAL_COMMUNITY): Payer: Self-pay | Admitting: *Deleted

## 2019-07-08 NOTE — Telephone Encounter (Signed)
Spoke with Mr Essner. His wife is currently in the hospital. Gurtej would like to cancel his orientation for 07/12/19 and will call back when he can reschedule. Will cancel orientation appointment. Gladstone Lighter, RN,BSN 07/08/2019 4:37 PM

## 2019-07-11 ENCOUNTER — Telehealth: Payer: Self-pay

## 2019-07-11 NOTE — Telephone Encounter (Signed)
Pt is unable to make his cardiac rehab orientation appointment on 07/12/2019 due to his wife being in the hospital. Canceled his cardiac rehab exercise appointments. Called pt to see when he would like to reschedule but didn't get an answer left VM for pt to call back. Place pt ppw in the ready to sch bin.

## 2019-07-11 NOTE — Telephone Encounter (Signed)
-----   Message from Quintella Reichert, MD sent at 07/10/2019  8:00 AM EDT ----- Regarding: RE: Cardiac Rehab Gabriel Jackson,  Please inform patient that he should stop using Cialis.  I would like him to talk with his PCP regarding Erectile dysfunction and other options.  Traci ----- Message ----- From: Arvilla Meres, [facility] Sent: 07/08/2019  10:48 AM EDT To: Quintella Reichert, MD Subject: Cardiac Rehab                                  Hello Dr. Mayford Knife,  The Pharmacy team conducts medication reconciliation on patients arriving for their orientation Cardiac Rehab visits. After conducting medication reconciliation, I would recommend the following changes to help optimize the patient's medication therapy:  Patient is currently on nitroglycerin SL tablets PRN and Cialis. There is a major drug-drug interaction between these two agents, including risk of fatal hypotension. Fortunately patient has not had to use SL nitroglycerin after hospitalization. Last time Mr. Brazie took the nitroglycerin, he was in the hospital and experienced hypotension that required IV fluids. I explained to Mr. Hartog details about this interaction and he understands the risk of taking these two medications together. He will follow up with you regarding future plan about taking these medications prior to taking future doses.  Please let me know if you have any questions  Thanks, Alvia Grove, PharmD PGY1 Acute Care Pharmacy Resident

## 2019-07-11 NOTE — Telephone Encounter (Signed)
Spoke with patient and advised him that Dr. Mayford Knife would like for him to come off of Cialis due to side effects it can cause if he has to take nitroglycerin. Patient verbalized understanding and will discontinue Cialis.  He states that he has not had to use nitroglycerin. It was given to him in the hospital and his blood pressure dropped significantly. Patient states that he had not taken Cialis for 2 weeks prior to that episode.

## 2019-07-12 ENCOUNTER — Ambulatory Visit (HOSPITAL_COMMUNITY): Payer: BC Managed Care – PPO

## 2019-07-18 ENCOUNTER — Ambulatory Visit (HOSPITAL_COMMUNITY): Payer: BC Managed Care – PPO

## 2019-07-20 ENCOUNTER — Ambulatory Visit (HOSPITAL_COMMUNITY): Payer: BC Managed Care – PPO

## 2019-07-22 ENCOUNTER — Ambulatory Visit (HOSPITAL_BASED_OUTPATIENT_CLINIC_OR_DEPARTMENT_OTHER): Payer: BC Managed Care – PPO

## 2019-07-22 ENCOUNTER — Telehealth (HOSPITAL_COMMUNITY): Payer: Self-pay

## 2019-07-22 ENCOUNTER — Other Ambulatory Visit: Payer: Self-pay

## 2019-07-22 ENCOUNTER — Ambulatory Visit (HOSPITAL_COMMUNITY): Payer: BC Managed Care – PPO

## 2019-07-22 NOTE — Telephone Encounter (Signed)
Called patient to see if he was interested in participating in the Cardiac Rehab Program. Patient stated yes. Patient will come in for orientation on 08/16/2019@9 :00am and will attend the 11:15am exercise class.

## 2019-07-25 ENCOUNTER — Ambulatory Visit (HOSPITAL_COMMUNITY): Payer: BC Managed Care – PPO

## 2019-07-27 ENCOUNTER — Ambulatory Visit (HOSPITAL_COMMUNITY): Payer: BC Managed Care – PPO

## 2019-07-29 ENCOUNTER — Ambulatory Visit (HOSPITAL_COMMUNITY): Payer: BC Managed Care – PPO

## 2019-07-29 DIAGNOSIS — Z03818 Encounter for observation for suspected exposure to other biological agents ruled out: Secondary | ICD-10-CM | POA: Diagnosis not present

## 2019-07-29 DIAGNOSIS — Z20828 Contact with and (suspected) exposure to other viral communicable diseases: Secondary | ICD-10-CM | POA: Diagnosis not present

## 2019-08-01 ENCOUNTER — Ambulatory Visit (HOSPITAL_COMMUNITY): Payer: BC Managed Care – PPO

## 2019-08-03 ENCOUNTER — Ambulatory Visit (HOSPITAL_COMMUNITY): Payer: BC Managed Care – PPO

## 2019-08-05 ENCOUNTER — Ambulatory Visit (HOSPITAL_COMMUNITY): Payer: BC Managed Care – PPO

## 2019-08-06 ENCOUNTER — Telehealth (HOSPITAL_COMMUNITY): Payer: Self-pay | Admitting: Pharmacist

## 2019-08-06 NOTE — Telephone Encounter (Signed)
Cardiac Rehab Medication Review by a Pharmacist  Does the patient  feel that his/her medications are working for him/her?  Yes - patient feels that they are working for him and feeling pretty good however he has been under a lot of stress recently with wife having prolonged hospitalization  Has the patient been experiencing any side effects to the medications prescribed?  no - tolerating well  Does the patient measure his/her own blood pressure or blood glucose at home?  yes but has not checked fre.quently Last BP check was running between 125-130s/80. Patient has been unable to check recently due to wife being in hospital.  Does the patient have any problems obtaining medications due to transportation or finances?   No issues right now but patient is working on patient assistance program.  Understanding of regimen: excellent Understanding of indications: good Potential of compliance: excellent - No missed doses in 30 days   Pharmacist comments: Patient used to be on on nitroglycerin SL tablets PRN and Cialis. There is a major drug-drug interaction between these two agents, including risk of fatal hypotension. Fortunately patient has not had to use SL nitroglycerin after hospitalization. Gabriel Jackson has stopped taking Cialis and knows of the drug-drug interaction.   Gabriel Jackson, PharmD PGY1 Acute Care Pharmacy Resident 08/06/2019 2:21 PM

## 2019-08-10 ENCOUNTER — Ambulatory Visit (HOSPITAL_COMMUNITY): Payer: BC Managed Care – PPO

## 2019-08-12 ENCOUNTER — Ambulatory Visit (HOSPITAL_COMMUNITY): Payer: BC Managed Care – PPO

## 2019-08-12 ENCOUNTER — Telehealth (HOSPITAL_COMMUNITY): Payer: Self-pay | Admitting: *Deleted

## 2019-08-12 ENCOUNTER — Other Ambulatory Visit: Payer: Self-pay

## 2019-08-12 ENCOUNTER — Encounter (HOSPITAL_COMMUNITY)
Admission: RE | Admit: 2019-08-12 | Discharge: 2019-08-12 | Disposition: A | Discharge status: Home / Self Care | Payer: BC Managed Care – PPO | Source: Ambulatory Visit | Attending: Cardiology | Admitting: Cardiology

## 2019-08-12 DIAGNOSIS — Z955 Presence of coronary angioplasty implant and graft: Secondary | ICD-10-CM | POA: Insufficient documentation

## 2019-08-12 NOTE — Telephone Encounter (Signed)
Spoke with patient completed health history over the phone. Confirmed appointment for orientation on 08/16/19.Gladstone Lighter, RN,BSN 08/12/2019 4:39 PM

## 2019-08-15 ENCOUNTER — Ambulatory Visit (HOSPITAL_COMMUNITY): Payer: BC Managed Care – PPO

## 2019-08-16 ENCOUNTER — Other Ambulatory Visit: Payer: Self-pay

## 2019-08-16 ENCOUNTER — Encounter (HOSPITAL_COMMUNITY): Payer: Self-pay

## 2019-08-16 ENCOUNTER — Encounter (HOSPITAL_COMMUNITY)
Admission: RE | Admit: 2019-08-16 | Discharge: 2019-08-16 | Disposition: A | Discharge status: Home / Self Care | Payer: BC Managed Care – PPO | Source: Ambulatory Visit | Attending: Cardiology | Admitting: Cardiology

## 2019-08-16 ENCOUNTER — Other Ambulatory Visit: Payer: BC Managed Care – PPO

## 2019-08-16 VITALS — BP 122/60 | HR 89 | Ht 71.25 in | Wt 219.8 lb

## 2019-08-16 DIAGNOSIS — E785 Hyperlipidemia, unspecified: Secondary | ICD-10-CM

## 2019-08-16 DIAGNOSIS — I2510 Atherosclerotic heart disease of native coronary artery without angina pectoris: Secondary | ICD-10-CM | POA: Diagnosis not present

## 2019-08-16 DIAGNOSIS — I10 Essential (primary) hypertension: Secondary | ICD-10-CM

## 2019-08-16 DIAGNOSIS — Z955 Presence of coronary angioplasty implant and graft: Secondary | ICD-10-CM

## 2019-08-16 LAB — LIPID PANEL
Chol/HDL Ratio: 2.8 ratio (ref 0.0–5.0)
Cholesterol, Total: 83 mg/dL — ABNORMAL LOW (ref 100–199)
HDL: 30 mg/dL — ABNORMAL LOW (ref >39)
LDL Chol Calc (NIH): 37 mg/dL (ref 0–99)
Triglycerides: 72 mg/dL (ref 0–149)
VLDL Cholesterol Cal: 16 mg/dL (ref 5–40)

## 2019-08-16 LAB — HEPATIC FUNCTION PANEL
ALT: 36 IU/L (ref 0–44)
AST: 33 IU/L (ref 0–40)
Albumin: 4.6 g/dL (ref 3.8–4.9)
Alkaline Phosphatase: 81 IU/L (ref 48–121)
Bilirubin Total: 1.5 mg/dL — ABNORMAL HIGH (ref 0.0–1.2)
Bilirubin, Direct: 0.4 mg/dL (ref 0.00–0.40)
Total Protein: 6.1 g/dL (ref 6.0–8.5)

## 2019-08-16 NOTE — Progress Notes (Signed)
Cardiac Individual Treatment Plan  Patient Details  Name: Gabriel Jackson MRN: [id number] Date of Birth: 26-Dec-1960 Referring Provider:     CARDIAC REHAB PHASE II ORIENTATION from 08/16/2019 in [place]  Referring Provider  Radford Pax, [place]      Initial Encounter Date:    CARDIAC REHAB PHASE II ORIENTATION from 08/16/2019 in [place]  Date  08/16/19      Visit Diagnosis: S/P DES LAD 06/06/19  Patient's Home Medications on Admission:  Current Outpatient Medications:  .  amLODipine (NORVASC) 5 MG tablet, Take 1 tablet (5 mg total) by mouth daily., Disp: 90 tablet, Rfl: 3 .  aspirin EC 81 MG tablet, Take 1 tablet (81 mg total) by mouth daily., Disp: 30 tablet, Rfl: 11 .  atorvastatin (LIPITOR) 80 MG tablet, Take 1 tablet (80 mg total) by mouth daily at 6 PM., Disp: 90 tablet, Rfl: 3 .  gabapentin (NEURONTIN) 300 MG capsule, Take 300 mg by mouth at bedtime., Disp: , Rfl:  .  Glucosamine HCl (GLUCOSAMINE PO), Take 2 tablets by mouth daily. , Disp: , Rfl:  .  metoprolol tartrate (LOPRESSOR) 25 MG tablet, Take 1 tablet (25 mg total) by mouth 2 (two) times daily., Disp: 60 tablet, Rfl: 11 .  Multiple Vitamin (MULTIVITAMIN) capsule, Take 1 capsule by mouth daily., Disp: , Rfl:  .  Multiple Vitamins-Minerals (EMERGEN-C IMMUNE PO), Take 1 tablet by mouth daily., Disp: , Rfl:  .  nitroGLYCERIN (NITROSTAT) 0.4 MG SL tablet, Place 1 tablet (0.4 mg total) under the tongue every 5 (five) minutes x 3 doses as needed for chest pain. (Patient not taking: Reported on 08/06/2019), Disp: 30 tablet, Rfl: 3 .  olmesartan (BENICAR) 40 MG tablet, Take 1 tablet (40 mg total) by mouth daily., Disp: 90 tablet, Rfl: 3 .  omeprazole (PRILOSEC) 20 MG capsule, Take 20 mg by mouth daily., Disp: , Rfl:  .  prasugrel (EFFIENT) 10 MG TABS tablet, Take 1 tablet (10 mg total) by mouth daily., Disp: 30 tablet, Rfl: 11  Past Medical History: Past Medical History:    Diagnosis Date  . Anxiety   . CAD (coronary artery disease)    Cath 06/06/19 (admx w [place]) >> mLAD 95, 60, 30; pRCA 30, mRCA 30 >> PCI:  DES to mLAD, D2 jailed by stent // Echo 05/2019: EF 60-65, mild asymmetric LVH, trivial MR, normal RVSf   . GERD (gastroesophageal reflux disease)   . HTN (hypertension)   . Hyperlipidemia   . OA (osteoarthritis)     Tobacco Use: Social History   Tobacco Use  Smoking Status Never Smoker  Smokeless Tobacco Never Used    Labs: Recent Review Scientist, physiological    Labs for ITP Cardiac and Pulmonary Rehab Latest Ref Rng & Units 06/03/2019   Cholestrol 0 - 200 mg/dL 193   LDLCALC 0 - 99 mg/dL 140(H)   HDL >40 mg/dL 40(L)   Trlycerides <150 mg/dL 66   Hemoglobin A1c 4.8 - 5.6 % 5.4      Capillary Blood Glucose: No results found for: GLUCAP   Exercise Target Goals: Exercise Program Goal: Individual exercise prescription set using results from initial 6 min walk test and THRR while considering  patient's activity barriers and safety.   Exercise Prescription Goal: Starting with aerobic activity 30 plus minutes a day, 3 days per week for initial exercise prescription. Provide home exercise prescription and guidelines that participant acknowledges understanding prior to discharge.  Activity Barriers &  Risk Stratification: Activity Barriers & Cardiac Risk Stratification - 08/16/19 1000      Activity Barriers & Cardiac Risk Stratification   Activity Barriers  None    Cardiac Risk Stratification  Low       6 Minute Walk: 6 Minute Walk    Row Name 08/16/19 0958         6 Minute Walk   Distance  1990 feet     Walk Time  6 minutes     # of Rest Breaks  0     MPH  3.76     METS  4.96     RPE  12     VO2 Peak  17.38     Resting HR  89 bpm     Resting BP  122/60     Resting Oxygen Saturation   98 %     Exercise Oxygen Saturation  during 6 min walk  98 %     Max Ex. HR  122 bpm     Max Ex. BP  142/80     2 Minute Post BP  122/76         Oxygen Initial Assessment:   Oxygen Re-Evaluation:   Oxygen Discharge (Final Oxygen Re-Evaluation):   Initial Exercise Prescription: Initial Exercise Prescription - 08/16/19 1000      Date of Initial Exercise RX and Referring Provider   Date  08/16/19    Referring Provider  Armanda Magic    Expected Discharge Date  10/14/19      Treadmill   MPH  3.2    Grade  0    Minutes  15    METs  3.45      NuStep   Level  3    SPM  70    Minutes  15    METs  2.5      Prescription Details   Frequency (times per week)  3    Duration  Progress to 30 minutes of continuous aerobic without signs/symptoms of physical distress      Intensity   THRR 40-80% of Max Heartrate  65-130    Ratings of Perceived Exertion  11-13      Progression   Progression  Continue progressive overload as per policy without signs/symptoms or physical distress.      Resistance Training   Training Prescription  Yes    Weight  4    Reps  10-15       Perform Capillary Blood Glucose checks as needed.  Exercise Prescription Changes:   Exercise Comments:   Exercise Goals and Review: Exercise Goals    Row Name 08/16/19 1001             Exercise Goals   Increase Physical Activity  Yes       Intervention  Provide advice, education, support and counseling about physical activity/exercise needs.;Develop an individualized exercise prescription for aerobic and resistive training based on initial evaluation findings, risk stratification, comorbidities and participant's personal goals.       Expected Outcomes  Short Term: Attend rehab on a regular basis to increase amount of physical activity.;Long Term: Add in home exercise to make exercise part of routine and to increase amount of physical activity.;Long Term: Exercising regularly at least 3-5 days a week.       Increase Strength and Stamina  Yes       Intervention  Provide advice, education, support and counseling about physical activity/exercise  needs.;Develop an individualized exercise prescription  for aerobic and resistive training based on initial evaluation findings, risk stratification, comorbidities and participant's personal goals.       Expected Outcomes  Short Term: Increase workloads from initial exercise prescription for resistance, speed, and METs.;Short Term: Perform resistance training exercises routinely during rehab and add in resistance training at home;Long Term: Improve cardiorespiratory fitness, muscular endurance and strength as measured by increased METs and functional capacity ( )       Able to understand and use rate of perceived exertion (RPE) scale  Yes       Intervention  Provide education and explanation on how to use RPE scale       Expected Outcomes  Short Term: Able to use RPE daily in rehab to express subjective intensity level;Long Term:  Able to use RPE to guide intensity level when exercising independently       Knowledge and understanding of Target Heart Rate Range (THRR)  Yes       Intervention  Provide education and explanation of THRR including how the numbers were predicted and where they are located for reference       Expected Outcomes  Short Term: Able to state/look up THRR;Short Term: Able to use daily as guideline for intensity in rehab;Long Term: Able to use THRR to govern intensity when exercising independently       Able to check pulse independently  Yes       Intervention  Provide education and demonstration on how to check pulse in carotid and radial arteries.;Review the importance of being able to check your own pulse for safety during independent exercise       Expected Outcomes  Short Term: Able to explain why pulse checking is important during independent exercise;Long Term: Able to check pulse independently and accurately       Understanding of Exercise Prescription  Yes       Intervention  Provide education, explanation, and written materials on patient's individual exercise prescription        Expected Outcomes  Short Term: Able to explain program exercise prescription;Long Term: Able to explain home exercise prescription to exercise independently          Exercise Goals Re-Evaluation :    Discharge Exercise Prescription (Final Exercise Prescription Changes):   Nutrition:  Target Goals: Understanding of nutrition guidelines, daily intake of sodium 1500mg , cholesterol 200mg , calories 30% from fat and 7% or less from saturated fats, daily to have 5 or more servings of fruits and vegetables.  Biometrics: Pre Biometrics - 08/16/19 1112      Pre Biometrics   Height  5' 11.25" (1.81 m)    Weight  99.7 kg    Waist Circumference  42 inches    Hip Circumference  42.5 inches    Waist to Hip Ratio  0.99 %    BMI (Calculated)  30.43    Triceps Skinfold  12 mm    % Body Fat  27.9 %    Grip Strength  42.5 kg    Flexibility  13 in    Single Leg Stand  87.25 seconds        Nutrition Therapy Plan and Nutrition Goals:   Nutrition Assessments:   Nutrition Goals Re-Evaluation:   Nutrition Goals Discharge (Final Nutrition Goals Re-Evaluation):   Psychosocial: Target Goals: Acknowledge presence or absence of significant depression and/or stress, maximize coping skills, provide positive support system. Participant is able to verbalize types and ability to use techniques and skills needed for reducing stress and  depression.  Initial Review & Psychosocial Screening: Initial Psych Review & Screening - 08/16/19 1032      Initial Review   Current issues with  Current Stress Concerns    Source of Stress Concerns  Family    Comments  Demarian's wife has recently been in the hospital he is concerned about her health      Family Dynamics   Good Support System?  Yes   Lashawn has his wife and daughter for support     Barriers   Psychosocial barriers to participate in program  The patient should benefit from training in stress management and relaxation.      Screening  Interventions   Interventions  Encouraged to exercise;To provide support and resources with identified psychosocial needs;Provide feedback about the scores to participant    Expected Outcomes  Long Term Goal: Stressors or current issues are controlled or eliminated.;Short Term goal: Identification and review with participant of any Quality of Life or Depression concerns found by scoring the questionnaire.;Long Term goal: The participant improves quality of Life and PHQ9 Scores as seen by post scores and/or verbalization of changes       Quality of Life Scores: Quality of Life - 08/16/19 1109      Quality of Life   Select  Quality of Life      Quality of Life Scores   Health/Function Pre  22.7 %    Socioeconomic Pre  24 %    Psych/Spiritual Pre  23.29 %    Family Pre  20.4 %    GLOBAL Pre  22.75 %      Scores of 19 and below usually indicate a poorer quality of life in these areas.  A difference of  2-3 points is a clinically meaningful difference.  A difference of 2-3 points in the total score of the Quality of Life Index has been associated with significant improvement in overall quality of life, self-image, physical symptoms, and general health in studies assessing change in quality of life.  PHQ-9: Recent Review Flowsheet Data    Depression screen [facility] 2/9 08/16/2019   Decreased Interest 0    Down, Depressed, Hopeless 0   PHQ - 2 Score 0     Interpretation of Total Score  Total Score Depression Severity:  1-4 = Minimal depression, 5-9 = Mild depression, 10-14 = Moderate depression, 15-19 = Moderately severe depression, 20-27 = Severe depression   Psychosocial Evaluation and Intervention:   Psychosocial Re-Evaluation:   Psychosocial Discharge (Final Psychosocial Re-Evaluation):   Vocational Rehabilitation: Provide vocational rehab assistance to qualifying candidates.   Vocational Rehab Evaluation & Intervention: Vocational Rehab - 08/16/19 0910      Initial Vocational  Rehab Evaluation & Intervention   Assessment shows need for Vocational Rehabilitation  No   Sherrod works full time and does not need vocational rehab at this time      Education: Education Goals: Education classes will be provided on a weekly basis, covering required topics. Participant will state understanding/return demonstration of topics presented.  Learning Barriers/Preferences: Learning Barriers/Preferences - 08/16/19 1125      Learning Barriers/Preferences   Learning Barriers  None    Learning Preferences  Written Material;Skilled Demonstration       Education Topics: Hypertension, Hypertension Reduction -Define heart disease and high blood pressure. Discus how high blood pressure affects the body and ways to reduce high blood pressure.   Exercise and Your Heart -Discuss why it is important to exercise, the FITT principles of exercise,  normal and abnormal responses to exercise, and how to exercise safely.   Angina -Discuss definition of angina, causes of angina, treatment of angina, and how to decrease risk of having angina.   Cardiac Medications -Review what the following cardiac medications are used for, how they affect the body, and side effects that may occur when taking the medications.  Medications include Aspirin, Beta blockers, calcium channel blockers, ACE Inhibitors, angiotensin receptor blockers, diuretics, digoxin, and antihyperlipidemics.   Congestive Heart Failure -Discuss the definition of CHF, how to live with CHF, the signs and symptoms of CHF, and how keep track of weight and sodium intake.   Heart Disease and Intimacy -Discus the effect sexual activity has on the heart, how changes occur during intimacy as we age, and safety during sexual activity.   Smoking Cessation / COPD -Discuss different methods to quit smoking, the health benefits of quitting smoking, and the definition of COPD.   Nutrition I: Fats -Discuss the types of cholesterol, what  cholesterol does to the heart, and how cholesterol levels can be controlled.   Nutrition II: Labels -Discuss the different components of food labels and how to read food label   Heart Parts/Heart Disease and PAD -Discuss the anatomy of the heart, the pathway of blood circulation through the heart, and these are affected by heart disease.   Stress I: Signs and Symptoms -Discuss the causes of stress, how stress may lead to anxiety and depression, and ways to limit stress.   Stress II: Relaxation -Discuss different types of relaxation techniques to limit stress.   Warning Signs of Stroke / TIA -Discuss definition of a stroke, what the signs and symptoms are of a stroke, and how to identify when someone is having stroke.   Knowledge Questionnaire Score: Knowledge Questionnaire Score - 08/16/19 1110      Knowledge Questionnaire Score   Pre Score  19/24       Core Components/Risk Factors/Patient Goals at Admission: Personal Goals and Risk Factors at Admission - 08/16/19 1111      Core Components/Risk Factors/Patient Goals on Admission    Weight Management  Yes;Obesity;Weight Loss    Intervention  Weight Management: Develop a combined nutrition and exercise program designed to reach desired caloric intake, while maintaining appropriate intake of nutrient and fiber, sodium and fats, and appropriate energy expenditure required for the weight goal.;Weight Management: Provide education and appropriate resources to help participant work on and attain dietary goals.;Weight Management/Obesity: Establish reasonable short term and long term weight goals.;Obesity: Provide education and appropriate resources to help participant work on and attain dietary goals.    Admit Weight  219 lb 4.8 oz (99.5 kg)    Goal Weight: Short Term  5 lb (2.268 kg)    Goal Weight: Long Term  20 lb (9.072 kg)    Expected Outcomes  Short Term: Continue to assess and modify interventions until short term weight is  achieved;Long Term: Adherence to nutrition and physical activity/exercise program aimed toward attainment of established weight goal;Weight Loss: Understanding of general recommendations for a balanced deficit meal plan, which promotes 1-2 lb weight loss per week and includes a negative energy balance of [phone] kcal/d;Understanding recommendations for meals to include 15-35% energy as protein, 25-35% energy from fat, 35-60% energy from carbohydrates, less than 200mg  of dietary cholesterol, 20-35 gm of total fiber daily;Understanding of distribution of calorie intake throughout the day with the consumption of 4-5 meals/snacks    Hypertension  Yes    Intervention  Provide education  on lifestyle modifcations including regular physical activity/exercise, weight management, moderate sodium restriction and increased consumption of fresh fruit, vegetables, and low fat dairy, alcohol moderation, and smoking cessation.;Monitor prescription use compliance.    Expected Outcomes  Short Term: Continued assessment and intervention until BP is < 140/49mm HG in hypertensive participants. < 130/103mm HG in hypertensive participants with diabetes, heart failure or chronic kidney disease.;Long Term: Maintenance of blood pressure at goal levels.    Lipids  Yes    Intervention  Provide education and support for participant on nutrition & aerobic/resistive exercise along with prescribed medications to achieve LDL 70mg , HDL >40mg .    Expected Outcomes  Short Term: Participant states understanding of desired cholesterol values and is compliant with medications prescribed. Participant is following exercise prescription and nutrition guidelines.;Long Term: Cholesterol controlled with medications as prescribed, with individualized exercise RX and with personalized nutrition plan. Value goals: LDL < 70mg , HDL > 40 mg.    Stress  Yes    Intervention  Offer individual and/or small group education and counseling on adjustment to heart  disease, stress management and health-related lifestyle change. Teach and support self-help strategies.    Expected Outcomes  Short Term: Participant demonstrates changes in health-related behavior, relaxation and other stress management skills, ability to obtain effective social support, and compliance with psychotropic medications if prescribed.;Long Term: Emotional wellbeing is indicated by absence of clinically significant psychosocial distress or social isolation.       Core Components/Risk Factors/Patient Goals Review:    Core Components/Risk Factors/Patient Goals at Discharge (Final Review):    ITP Comments: ITP Comments    Row Name 08/16/19 0909           ITP Comments  Dr [id number] MD, Medical Director          Comments: Patient attended orientation on 08/16/2019 to review rules and guidelines for program.  Completed 6 minute walk test, Intitial ITP, and exercise prescription.  VSS. Telemetry-Sinus Rhythm.  Asymptomatic. Safety measures and social distancing in place per CDC guidelines.10/16/2019, RN,BSN 08/16/2019 12:06 PM

## 2019-08-17 ENCOUNTER — Ambulatory Visit (HOSPITAL_COMMUNITY): Payer: BC Managed Care – PPO

## 2019-08-19 ENCOUNTER — Ambulatory Visit (HOSPITAL_COMMUNITY): Payer: BC Managed Care – PPO

## 2019-08-22 ENCOUNTER — Other Ambulatory Visit: Payer: Self-pay

## 2019-08-22 ENCOUNTER — Encounter (HOSPITAL_COMMUNITY)
Admission: RE | Admit: 2019-08-22 | Discharge: 2019-08-22 | Disposition: A | Discharge status: Home / Self Care | Payer: BC Managed Care – PPO | Source: Ambulatory Visit | Attending: Cardiology | Admitting: Cardiology

## 2019-08-22 ENCOUNTER — Ambulatory Visit (HOSPITAL_COMMUNITY): Payer: BC Managed Care – PPO

## 2019-08-22 VITALS — Wt 219.8 lb

## 2019-08-22 DIAGNOSIS — Z955 Presence of coronary angioplasty implant and graft: Secondary | ICD-10-CM

## 2019-08-22 NOTE — Progress Notes (Signed)
Daily Session Note  Patient Details  Name: Gabriel Jackson MRN: [id number] Date of Birth: 1961/01/21 Referring Provider:     CARDIAC REHAB PHASE II ORIENTATION from 08/16/2019 in [place]  Referring Provider Fransico Him      Encounter Date: 08/22/2019  Check In:  Session Check In - 08/22/19 1128      Check-In   Supervising physician immediately available to respond to emergencies [facility] immediately available    Physician(s) Dr Cruzita Lederer    Location [facility]    Staff Present Barnet Pall, RN, BSN;David Makemson MS, EP-C, CCRP;Olinty Celesta Aver, MS, ACSM CEP, Exercise Physiologist;Tyara Carol Ada, MS,ACSM CEP, Exercise Physiologist    Virtual Visit No    Medication changes reported     No    Fall or balance concerns reported    No    Tobacco Cessation No Change    Warm-up and Cool-down Performed on first and last piece of equipment    Resistance Training Performed Yes    VAD Patient? No    PAD/SET Patient? No      Pain Assessment   Currently in Pain? No/denies    Pain Score 0-No pain    Multiple Pain Sites No           Capillary Blood Glucose: No results found for this or any previous visit (from the past 24 hour(s)).   Exercise Prescription Changes - 08/22/19 1400      Response to Exercise   Blood Pressure (Admit) 112/70    Blood Pressure (Exercise) 124/70    Blood Pressure (Exit) 118/70    Heart Rate (Admit) 71 bpm    Heart Rate (Exercise) 98 bpm    Heart Rate (Exit) 64 bpm    Rating of Perceived Exertion (Exercise) 11    Perceived Dyspnea (Exercise) 0    Symptoms None    Comments Pt's first day of exercise     Duration Progress to 10 minutes continuous walking  at current work load and total walking time to 30-45 min    Intensity THRR unchanged      Progression   Progression Continue to progress workloads to maintain intensity without signs/symptoms of physical distress.    Average METs 2.7       Resistance Training   Training Prescription Yes    Weight 4lbs    Reps 10-15    Time 10 Minutes      Treadmill   MPH 2.7    Grade 0    Minutes 15    METs 3.07      NuStep   Level 3    SPM 85    Minutes 15    METs 2.4           Social History   Tobacco Use  Smoking Status Never Smoker  Smokeless Tobacco Never Used    Goals Met:  Exercise tolerated well No report of cardiac concerns or symptoms Strength training completed today  Goals Unmet:  Not Applicable  Comments: [name] started cardiac rehab today.  Pt tolerated light exercise without difficulty. VSS, telemetry-Sinus Rhythm, asymptomatic.  Medication list reconciled. Pt denies barriers to medicaiton compliance.  PSYCHOSOCIAL ASSESSMENT:  PHQ-0. Pt exhibits positive coping skills, hopeful outlook with supportive family. No psychosocial needs identified at this time, no psychosocial interventions necessary.    Pt enjoys his dogs and travelling.   Pt oriented to exercise equipment and routine.    Understanding verbalized.Barnet Pall, RN,BSN 08/22/2019 2:59 PM  Dr. Traci Turner is Medical Director for Cardiac Rehab at [facility]. 

## 2019-08-24 ENCOUNTER — Other Ambulatory Visit: Payer: Self-pay

## 2019-08-24 ENCOUNTER — Encounter (HOSPITAL_COMMUNITY)
Admission: RE | Admit: 2019-08-24 | Discharge: 2019-08-24 | Disposition: A | Discharge status: Home / Self Care | Payer: BC Managed Care – PPO | Source: Ambulatory Visit | Attending: Cardiology | Admitting: Cardiology

## 2019-08-24 ENCOUNTER — Ambulatory Visit (HOSPITAL_COMMUNITY): Payer: BC Managed Care – PPO

## 2019-08-24 DIAGNOSIS — Z955 Presence of coronary angioplasty implant and graft: Secondary | ICD-10-CM | POA: Diagnosis not present

## 2019-08-26 ENCOUNTER — Other Ambulatory Visit: Payer: Self-pay

## 2019-08-26 ENCOUNTER — Encounter (HOSPITAL_COMMUNITY)
Admission: RE | Admit: 2019-08-26 | Discharge: 2019-08-26 | Disposition: A | Discharge status: Home / Self Care | Payer: BC Managed Care – PPO | Source: Ambulatory Visit | Attending: Cardiology | Admitting: Cardiology

## 2019-08-26 ENCOUNTER — Ambulatory Visit (HOSPITAL_COMMUNITY): Payer: BC Managed Care – PPO

## 2019-08-26 DIAGNOSIS — Z955 Presence of coronary angioplasty implant and graft: Secondary | ICD-10-CM

## 2019-08-26 NOTE — Progress Notes (Signed)
Gabriel Jackson 59 y.o. male Nutrition Note  Visit Diagnosis: S/P DES LAD 06/06/19  Past Medical History:  Diagnosis Date  . Anxiety   . CAD (coronary artery disease)    Cath 06/06/19 (admx w [place]) >> mLAD 95, 60, 30; pRCA 30, mRCA 30 >> PCI:  DES to mLAD, D2 jailed by stent // Echo 05/2019: EF 60-65, mild asymmetric LVH, trivial MR, normal RVSf   . GERD (gastroesophageal reflux disease)   . HTN (hypertension)   . Hyperlipidemia   . OA (osteoarthritis)      Medications reviewed.   Current Outpatient Medications:  .  amLODipine (NORVASC) 5 MG tablet, Take 1 tablet (5 mg total) by mouth daily., Disp: 90 tablet, Rfl: 3 .  aspirin EC 81 MG tablet, Take 1 tablet (81 mg total) by mouth daily., Disp: 30 tablet, Rfl: 11 .  atorvastatin (LIPITOR) 80 MG tablet, Take 1 tablet (80 mg total) by mouth daily at 6 PM., Disp: 90 tablet, Rfl: 3 .  gabapentin (NEURONTIN) 300 MG capsule, Take 300 mg by mouth at bedtime., Disp: , Rfl:  .  Glucosamine HCl (GLUCOSAMINE PO), Take 2 tablets by mouth daily. , Disp: , Rfl:  .  metoprolol tartrate (LOPRESSOR) 25 MG tablet, Take 1 tablet (25 mg total) by mouth 2 (two) times daily., Disp: 60 tablet, Rfl: 11 .  Multiple Vitamin (MULTIVITAMIN) capsule, Take 1 capsule by mouth daily., Disp: , Rfl:  .  Multiple Vitamins-Minerals (EMERGEN-C IMMUNE PO), Take 1 tablet by mouth daily., Disp: , Rfl:  .  nitroGLYCERIN (NITROSTAT) 0.4 MG SL tablet, Place 1 tablet (0.4 mg total) under the tongue every 5 (five) minutes x 3 doses as needed for chest pain. (Patient not taking: Reported on 08/06/2019), Disp: 30 tablet, Rfl: 3 .  olmesartan (BENICAR) 40 MG tablet, Take 1 tablet (40 mg total) by mouth daily., Disp: 90 tablet, Rfl: 3 .  omeprazole (PRILOSEC) 20 MG capsule, Take 20 mg by mouth daily., Disp: , Rfl:  .  prasugrel (EFFIENT) 10 MG TABS tablet, Take 1 tablet (10 mg total) by mouth daily., Disp: 30 tablet, Rfl: 11   Ht Readings from Last 1 Encounters:  08/16/19 5' 11.25"  (1.81 m)     Wt Readings from Last 3 Encounters:  08/16/19 219 lb 12.8 oz (99.7 kg)  07/22/19 222 lb (100.7 kg)  06/21/19 229 lb (103.9 kg)     There is no height or weight on file to calculate BMI.   Social History   Tobacco Use  Smoking Status Never Smoker  Smokeless Tobacco Never Used     Lab Results  Component Value Date   CHOL 83 (L) 08/16/2019   Lab Results  Component Value Date   HDL 30 (L) 08/16/2019   Lab Results  Component Value Date   LDLCALC 37 08/16/2019   Lab Results  Component Value Date   TRIG 72 08/16/2019     Lab Results  Component Value Date   HGBA1C 5.4 06/03/2019     CBG (last 3)  No results for input(s): GLUCAP in the last 72 hours.   Nutrition Note  Spoke with pt. Nutrition Plan and Nutrition Survey goals reviewed with pt. Pt is following a Heart Healthy diet.   Pt made appropriate changes to diet after his stent. He has been avoiding red meat, fried foods, and using olive oil to cook. He started packing lunches instead of eating out. He avoids SSB. He tries to choose whole grains most of the  time. He avoids salt most of the time and has been trying to read labels.  He lost 20 lbs making these changes.  He is interested in further education to continue making heart healthy changes.   Pt expressed understanding of the information reviewed.   Nutrition Diagnosis ? Food-and nutrition-related knowledge deficit related to lack of exposure to information as related to diagnosis of: ? CVD ?  Nutrition Intervention ? Pt's individual nutrition plan reviewed with pt. ? Benefits of adopting Heart Healthy diet discussed when Med[employer]s reviewed.   ? Continue client-centered nutrition education by RD, as part of interdisciplinary care.  Goal(s) ? Pt to build a healthy plate including vegetables, fruits, whole grains, and low-fat dairy products in a heart healthy meal plan.  Plan:   Will provide client-centered nutrition education as part of  interdisciplinary care  Monitor and evaluate progress toward nutrition goal with team.   Andrey Campanile, MS, RDN, LDN

## 2019-08-29 ENCOUNTER — Encounter (HOSPITAL_COMMUNITY)
Admission: RE | Admit: 2019-08-29 | Discharge: 2019-08-29 | Disposition: A | Discharge status: Home / Self Care | Payer: BC Managed Care – PPO | Source: Ambulatory Visit | Attending: Cardiology | Admitting: Cardiology

## 2019-08-29 ENCOUNTER — Other Ambulatory Visit: Payer: Self-pay

## 2019-08-29 ENCOUNTER — Ambulatory Visit (HOSPITAL_COMMUNITY): Payer: BC Managed Care – PPO

## 2019-08-29 DIAGNOSIS — Z955 Presence of coronary angioplasty implant and graft: Secondary | ICD-10-CM | POA: Diagnosis not present

## 2019-08-30 NOTE — Progress Notes (Signed)
Cardiac Individual Treatment Plan  Patient Details  Name: Gabriel Jackson MRN: [id number] Date of Birth: [birth year] Referring Provider:     CARDIAC REHAB PHASE II ORIENTATION from 08/16/2019 in [facility] CARDIAC [facility]  Referring Provider Mayford Knife, [place]      Initial Encounter Date:    CARDIAC REHAB PHASE II ORIENTATION from 08/16/2019 in [facility] CARDIAC REHAB  Date 08/16/19      Visit Diagnosis: S/P DES LAD 06/06/19  Patient's Home Medications on Admission:  Current Outpatient Medications:    amLODipine (NORVASC) 5 MG tablet, Take 1 tablet (5 mg total) by mouth daily., Disp: 90 tablet, Rfl: 3   aspirin EC 81 MG tablet, Take 1 tablet (81 mg total) by mouth daily., Disp: 30 tablet, Rfl: 11   atorvastatin (LIPITOR) 80 MG tablet, Take 1 tablet (80 mg total) by mouth daily at 6 PM., Disp: 90 tablet, Rfl: 3   gabapentin (NEURONTIN) 300 MG capsule, Take 300 mg by mouth at bedtime., Disp: , Rfl:    Glucosamine HCl (GLUCOSAMINE PO), Take 2 tablets by mouth daily. , Disp: , Rfl:    metoprolol tartrate (LOPRESSOR) 25 MG tablet, Take 1 tablet (25 mg total) by mouth 2 (two) times daily., Disp: 60 tablet, Rfl: 11   Multiple Vitamin (MULTIVITAMIN) capsule, Take 1 capsule by mouth daily., Disp: , Rfl:    Multiple Vitamins-Minerals (EMERGEN-C IMMUNE PO), Take 1 tablet by mouth daily., Disp: , Rfl:    nitroGLYCERIN (NITROSTAT) 0.4 MG SL tablet, Place 1 tablet (0.4 mg total) under the tongue every 5 (five) minutes x 3 doses as needed for chest pain. (Patient not taking: Reported on 08/06/2019), Disp: 30 tablet, Rfl: 3   olmesartan (BENICAR) 40 MG tablet, Take 1 tablet (40 mg total) by mouth daily., Disp: 90 tablet, Rfl: 3   omeprazole (PRILOSEC) 20 MG capsule, Take 20 mg by mouth daily., Disp: , Rfl:    prasugrel (EFFIENT) 10 MG TABS tablet, Take 1 tablet (10 mg total) by mouth daily., Disp: 30 tablet, Rfl: 11  Past Medical History: Past Medical History:    Diagnosis Date   Anxiety    CAD (coronary artery disease)    Cath 06/06/19 (admx w [place]) >> mLAD 95, 60, 30; pRCA 30, mRCA 30 >> PCI:  DES to mLAD, D2 jailed by stent // Echo 05/2019: EF 60-65, mild asymmetric LVH, trivial MR, normal RVSf    GERD (gastroesophageal reflux disease)    HTN (hypertension)    Hyperlipidemia    OA (osteoarthritis)     Tobacco Use: Social History   Tobacco Use  Smoking Status Never Smoker  Smokeless Tobacco Never Used    Labs: Recent Review Advice worker    Labs for ITP Cardiac and Pulmonary Rehab Latest Ref Rng & Units 06/03/2019 08/16/2019   Cholestrol 100 - 199 mg/dL 409 81(X)   LDLCALC 0 - 99 mg/dL 914(N) 37   HDL >82 mg/dL 95(A) 21(H)   Trlycerides 0 - 149 mg/dL 66 72   Hemoglobin Y8M 4.8 - 5.6 % 5.4 -      Capillary Blood Glucose: No results found for: GLUCAP   Exercise Target Goals: Exercise Program Goal: Individual exercise prescription set using results from initial 6 min walk test and THRR while considering  patients activity barriers and safety.   Exercise Prescription Goal: Starting with aerobic activity 30 plus minutes a day, 3 days per week for initial exercise prescription. Provide home exercise prescription and guidelines that participant acknowledges understanding  prior to discharge.  Activity Barriers & Risk Stratification:  Activity Barriers & Cardiac Risk Stratification - 08/16/19 1000      Activity Barriers & Cardiac Risk Stratification   Activity Barriers None    Cardiac Risk Stratification Low           6 Minute Walk:  6 Minute Walk    Row Name 08/16/19 0958         6 Minute Walk   Distance 1990 feet     Walk Time 6 minutes     # of Rest Breaks 0     MPH 3.76     METS 4.96     RPE 12     VO2 Peak 17.38     Resting HR 89 bpm     Resting BP 122/60     Resting Oxygen Saturation  98 %     Exercise Oxygen Saturation  during 6 min walk 98 %     Max Ex. HR 122 bpm     Max Ex. BP 142/80     2 Minute  Post BP 122/76            Oxygen Initial Assessment:   Oxygen Re-Evaluation:   Oxygen Discharge (Final Oxygen Re-Evaluation):   Initial Exercise Prescription:  Initial Exercise Prescription - 08/16/19 1000      Date of Initial Exercise RX and Referring Provider   Date 08/16/19    Referring Provider Armanda Magic    Expected Discharge Date 10/14/19      Treadmill   MPH 3.2    Grade 0    Minutes 15    METs 3.45      NuStep   Level 3    SPM 70    Minutes 15    METs 2.5      Prescription Details   Frequency (times per week) 3    Duration Progress to 30 minutes of continuous aerobic without signs/symptoms of physical distress      Intensity   THRR 40-80% of Max Heartrate 65-130    Ratings of Perceived Exertion 11-13      Progression   Progression Continue progressive overload as per policy without signs/symptoms or physical distress.      Resistance Training   Training Prescription Yes    Weight 4    Reps 10-15           Perform Capillary Blood Glucose checks as needed.  Exercise Prescription Changes:   Exercise Prescription Changes    Row Name 08/22/19 1400 08/31/19 1318           Response to Exercise   Blood Pressure (Admit) 112/70 132/80      Blood Pressure (Exercise) 124/70 128/72      Blood Pressure (Exit) 118/70 104/70      Heart Rate (Admit) 71 bpm 80 bpm      Heart Rate (Exercise) 98 bpm 117 bpm      Heart Rate (Exit) 64 bpm 88 bpm      Rating of Perceived Exertion (Exercise) 11 12      Perceived Dyspnea (Exercise) 0 0      Symptoms None None      Comments Pt's first day of exercise  None      Duration Progress to 10 minutes continuous walking  at current work load and total walking time to 30-45 min Progress to 10 minutes continuous walking  at current work load and total walking time to 30-45 min  Intensity THRR unchanged THRR unchanged        Progression   Progression Continue to progress workloads to maintain intensity without  signs/symptoms of physical distress. Continue to progress workloads to maintain intensity without signs/symptoms of physical distress.      Average METs 2.7 3.8        Resistance Training   Training Prescription Yes No      Weight 4lbs --      Reps 10-15 --      Time 10 Minutes --        Treadmill   MPH 2.7 3.2      Grade 0 2      Minutes 15 15      METs 3.07 4.33        NuStep   Level 3 5      SPM 85 95      Minutes 15 15      METs 2.4 3.2             Exercise Comments:   Exercise Comments    Row Name 08/22/19 1428           Exercise Comments Pt's first day of exercise. Pt responded well to exercise prescription. Will continue to monitor and progress pt as tolerated.              Exercise Goals and Review:   Exercise Goals    Row Name 08/16/19 1001             Exercise Goals   Increase Physical Activity Yes       Intervention Provide advice, education, support and counseling about physical activity/exercise needs.;Develop an individualized exercise prescription for aerobic and resistive training based on initial evaluation findings, risk stratification, comorbidities and participant's personal goals.       Expected Outcomes Short Term: Attend rehab on a regular basis to increase amount of physical activity.;Long Term: Add in home exercise to make exercise part of routine and to increase amount of physical activity.;Long Term: Exercising regularly at least 3-5 days a week.       Increase Strength and Stamina Yes       Intervention Provide advice, education, support and counseling about physical activity/exercise needs.;Develop an individualized exercise prescription for aerobic and resistive training based on initial evaluation findings, risk stratification, comorbidities and participant's personal goals.       Expected Outcomes Short Term: Increase workloads from initial exercise prescription for resistance, speed, and METs.;Short Term: Perform resistance  training exercises routinely during rehab and add in resistance training at home;Long Term: Improve cardiorespiratory fitness, muscular endurance and strength as measured by increased METs and functional capacity ( )       Able to understand and use rate of perceived exertion (RPE) scale Yes       Intervention Provide education and explanation on how to use RPE scale       Expected Outcomes Short Term: Able to use RPE daily in rehab to express subjective intensity level;Long Term:  Able to use RPE to guide intensity level when exercising independently       Knowledge and understanding of Target Heart Rate Range (THRR) Yes       Intervention Provide education and explanation of THRR including how the numbers were predicted and where they are located for reference       Expected Outcomes Short Term: Able to state/look up THRR;Short Term: Able to use daily as guideline for intensity in rehab;Long Term: Able to  use THRR to govern intensity when exercising independently       Able to check pulse independently Yes       Intervention Provide education and demonstration on how to check pulse in carotid and radial arteries.;Review the importance of being able to check your own pulse for safety during independent exercise       Expected Outcomes Short Term: Able to explain why pulse checking is important during independent exercise;Long Term: Able to check pulse independently and accurately       Understanding of Exercise Prescription Yes       Intervention Provide education, explanation, and written materials on patient's individual exercise prescription       Expected Outcomes Short Term: Able to explain program exercise prescription;Long Term: Able to explain home exercise prescription to exercise independently              Exercise Goals Re-Evaluation :  Exercise Goals Re-Evaluation    Row Name 08/22/19 1430             Exercise Goal Re-Evaluation   Exercise Goals Review Increase Strength and  Stamina;Knowledge and understanding of Target Heart Rate Range (THRR);Understanding of Exercise Prescription       Comments Pt's first day of exercise. Oriented pt to equipment. Will continue to monitor.       Expected Outcomes Pt will continue to increase strength and stamina.               Discharge Exercise Prescription (Final Exercise Prescription Changes):  Exercise Prescription Changes - 08/31/19 1318      Response to Exercise   Blood Pressure (Admit) 132/80    Blood Pressure (Exercise) 128/72    Blood Pressure (Exit) 104/70    Heart Rate (Admit) 80 bpm    Heart Rate (Exercise) 117 bpm    Heart Rate (Exit) 88 bpm    Rating of Perceived Exertion (Exercise) 12    Perceived Dyspnea (Exercise) 0    Symptoms None    Comments None    Duration Progress to 10 minutes continuous walking  at current work load and total walking time to 30-45 min    Intensity THRR unchanged      Progression   Progression Continue to progress workloads to maintain intensity without signs/symptoms of physical distress.    Average METs 3.8      Resistance Training   Training Prescription No      Treadmill   MPH 3.2    Grade 2    Minutes 15    METs 4.33      NuStep   Level 5    SPM 95    Minutes 15    METs 3.2           Nutrition:  Target Goals: Understanding of nutrition guidelines, daily intake of sodium 1500mg , cholesterol 200mg , calories 30% from fat and 7% or less from saturated fats, daily to have 5 or more servings of fruits and vegetables.  Biometrics:  Pre Biometrics - 08/16/19 1112      Pre Biometrics   Height 5' 11.25" (1.81 m)    Weight 99.7 kg    Waist Circumference 42 inches    Hip Circumference 42.5 inches    Waist to Hip Ratio 0.99 %    BMI (Calculated) 30.43    Triceps Skinfold 12 mm    % Body Fat 27.9 %    Grip Strength 42.5 kg    Flexibility 13 in    Single Leg  Stand 87.25 seconds            Nutrition Therapy Plan and Nutrition Goals:  Nutrition  Therapy & Goals - 09/01/19 0819      Nutrition Therapy   Diet Heart healthy    Drug/Food Interactions Statins/Certain Fruits      Personal Nutrition Goals   Nutrition Goal Pt to build a healthy plate including vegetables, fruits, whole grains, and low-fat dairy products in a heart healthy meal plan.      Intervention Plan   Intervention Prescribe, educate and counsel regarding individualized specific dietary modifications aiming towards targeted core components such as weight, hypertension, lipid management, diabetes, heart failure and other comorbidities.;Nutrition handout(s) given to patient.    Expected Outcomes Short Term Goal: Understand basic principles of dietary content, such as calories, fat, sodium, cholesterol and nutrients.           Nutrition Assessments:   Nutrition Goals Re-Evaluation:  Nutrition Goals Re-Evaluation    Row Name 09/01/19 0820             Goals   Current Weight 219 lb 12.8 oz (99.7 kg)       Nutrition Goal Pt to build a healthy plate including vegetables, fruits, whole grains, and low-fat dairy products in a heart healthy meal plan.              Nutrition Goals Discharge (Final Nutrition Goals Re-Evaluation):  Nutrition Goals Re-Evaluation - 09/01/19 0820      Goals   Current Weight 219 lb 12.8 oz (99.7 kg)    Nutrition Goal Pt to build a healthy plate including vegetables, fruits, whole grains, and low-fat dairy products in a heart healthy meal plan.           Psychosocial: Target Goals: Acknowledge presence or absence of significant depression and/or stress, maximize coping skills, provide positive support system. Participant is able to verbalize types and ability to use techniques and skills needed for reducing stress and depression.  Initial Review & Psychosocial Screening:  Initial Psych Review & Screening - 08/16/19 1032      Initial Review   Current issues with Current Stress Concerns    Source of Stress Concerns Family     Comments Doron's wife has recently been in the hospital he is concerned about her health      Family Dynamics   Good Support System? Yes   Mete has his wife and daughter for support     Barriers   Psychosocial barriers to participate in program The patient should benefit from training in stress management and relaxation.      Screening Interventions   Interventions Encouraged to exercise;To provide support and resources with identified psychosocial needs;Provide feedback about the scores to participant    Expected Outcomes Long Term Goal: Stressors or current issues are controlled or eliminated.;Short Term goal: Identification and review with participant of any Quality of Life or Depression concerns found by scoring the questionnaire.;Long Term goal: The participant improves quality of Life and PHQ9 Scores as seen by post scores and/or verbalization of changes           Quality of Life Scores:  Quality of Life - 08/16/19 1109      Quality of Life   Select Quality of Life      Quality of Life Scores   Health/Function Pre 22.7 %    Socioeconomic Pre 24 %    Psych/Spiritual Pre 23.29 %    Family Pre 20.4 %  GLOBAL Pre 22.75 %          Scores of 19 and below usually indicate a poorer quality of life in these areas.  A difference of  2-3 points is a clinically meaningful difference.  A difference of 2-3 points in the total score of the Quality of Life Index has been associated with significant improvement in overall quality of life, self-image, physical symptoms, and general health in studies assessing change in quality of life.  PHQ-9: Recent Review Flowsheet Data    [facility]   Decreased Interest 0    Down, Depressed, Hopeless 0   PHQ - 2 Score 0     Interpretation of Total Score  Total Score Depression Severity:  1-4 = Minimal depression, 5-9 = Mild depression, 10-14 = Moderate depression, 15-19 = Moderately severe depression, 20-27 = Severe  depression   Psychosocial Evaluation and Intervention:   Psychosocial Re-Evaluation:  Psychosocial Re-Evaluation    Row Name 08/30/19 1542             Psychosocial Re-Evaluation   Current issues with Current Stress Concerns       Comments Fabyan's continues to have some stress concerns regarding his family/ wife who is at home and doing better       Expected Outcomes Will continue to monitor and offer support as needed       Interventions Encouraged to attend Cardiac Rehabilitation for the exercise;Stress management education         Initial Review   Source of Stress Concerns Family              Psychosocial Discharge (Final Psychosocial Re-Evaluation):  Psychosocial Re-Evaluation - 08/30/19 1542      Psychosocial Re-Evaluation   Current issues with Current Stress Concerns    Comments Eudell's continues to have some stress concerns regarding his family/ wife who is at home and doing better    Expected Outcomes Will continue to monitor and offer support as needed    Interventions Encouraged to attend Cardiac Rehabilitation for the exercise;Stress management education      Initial Review   Source of Stress Concerns Family           Vocational Rehabilitation: Provide vocational rehab assistance to qualifying candidates.   Vocational Rehab Evaluation & Intervention:  Vocational Rehab - 08/16/19 0910      Initial Vocational Rehab Evaluation & Intervention   Assessment shows need for Vocational Rehabilitation No   Hessie Dienerlan works full time and does not need vocational rehab at this time          Education: Education Goals: Education classes will be provided on a weekly basis, covering required topics. Participant will state understanding/return demonstration of topics presented.  Learning Barriers/Preferences:  Learning Barriers/Preferences - 08/16/19 1125      Learning Barriers/Preferences   Learning Barriers None    Learning Preferences Written Material;Skilled  Demonstration           Education Topics: Hypertension, Hypertension Reduction -Define heart disease and high blood pressure. Discus how high blood pressure affects the body and ways to reduce high blood pressure.   Exercise and Your Heart -Discuss why it is important to exercise, the FITT principles of exercise, normal and abnormal responses to exercise, and how to exercise safely.   Angina -Discuss definition of angina, causes of angina, treatment of angina, and how to decrease risk of having angina.   Cardiac Medications -Review what the following cardiac medications are used for,  how they affect the body, and side effects that may occur when taking the medications.  Medications include Aspirin, Beta blockers, calcium channel blockers, ACE Inhibitors, angiotensin receptor blockers, diuretics, digoxin, and antihyperlipidemics.   Congestive Heart Failure -Discuss the definition of CHF, how to live with CHF, the signs and symptoms of CHF, and how keep track of weight and sodium intake.   Heart Disease and Intimacy -Discus the effect sexual activity has on the heart, how changes occur during intimacy as we age, and safety during sexual activity.   Smoking Cessation / COPD -Discuss different methods to quit smoking, the health benefits of quitting smoking, and the definition of COPD.   Nutrition I: Fats -Discuss the types of cholesterol, what cholesterol does to the heart, and how cholesterol levels can be controlled.   Nutrition II: Labels -Discuss the different components of food labels and how to read food label   Heart Parts/Heart Disease and PAD -Discuss the anatomy of the heart, the pathway of blood circulation through the heart, and these are affected by heart disease.   Stress I: Signs and Symptoms -Discuss the causes of stress, how stress may lead to anxiety and depression, and ways to limit stress.   Stress II: Relaxation -Discuss different types of  relaxation techniques to limit stress.   Warning Signs of Stroke / TIA -Discuss definition of a stroke, what the signs and symptoms are of a stroke, and how to identify when someone is having stroke.   Knowledge Questionnaire Score:  Knowledge Questionnaire Score - 08/16/19 1110      Knowledge Questionnaire Score   Pre Score 19/24           Core Components/Risk Factors/Patient Goals at Admission:  Personal Goals and Risk Factors at Admission - 08/16/19 1111      Core Components/Risk Factors/Patient Goals on Admission    Weight Management Yes;Obesity;Weight Loss    Intervention Weight Management: Develop a combined nutrition and exercise program designed to reach desired caloric intake, while maintaining appropriate intake of nutrient and fiber, sodium and fats, and appropriate energy expenditure required for the weight goal.;Weight Management: Provide education and appropriate resources to help participant work on and attain dietary goals.;Weight Management/Obesity: Establish reasonable short term and long term weight goals.;Obesity: Provide education and appropriate resources to help participant work on and attain dietary goals.    Admit Weight 219 lb 4.8 oz (99.5 kg)    Goal Weight: Short Term 5 lb (2.268 kg)    Goal Weight: Long Term 20 lb (9.072 kg)    Expected Outcomes Short Term: Continue to assess and modify interventions until short term weight is achieved;Long Term: Adherence to nutrition and physical activity/exercise program aimed toward attainment of established weight goal;Weight Loss: Understanding of general recommendations for a balanced deficit meal plan, which promotes 1-2 lb weight loss per week and includes a negative energy balance of [phone] kcal/d;Understanding recommendations for meals to include 15-35% energy as protein, 25-35% energy from fat, 35-60% energy from carbohydrates, less than 200mg  of dietary cholesterol, 20-35 gm of total fiber daily;Understanding of  distribution of calorie intake throughout the day with the consumption of 4-5 meals/snacks    Hypertension Yes    Intervention Provide education on lifestyle modifcations including regular physical activity/exercise, weight management, moderate sodium restriction and increased consumption of fresh fruit, vegetables, and low fat dairy, alcohol moderation, and smoking cessation.;Monitor prescription use compliance.    Expected Outcomes Short Term: Continued assessment and intervention until BP is < 140/59mm HG  in hypertensive participants. < 130/37mm HG in hypertensive participants with diabetes, heart failure or chronic kidney disease.;Long Term: Maintenance of blood pressure at goal levels.    Lipids Yes    Intervention Provide education and support for participant on nutrition & aerobic/resistive exercise along with prescribed medications to achieve LDL 70mg , HDL >40mg .    Expected Outcomes Short Term: Participant states understanding of desired cholesterol values and is compliant with medications prescribed. Participant is following exercise prescription and nutrition guidelines.;Long Term: Cholesterol controlled with medications as prescribed, with individualized exercise RX and with personalized nutrition plan. Value goals: LDL < 70mg , HDL > 40 mg.    Stress Yes    Intervention Offer individual and/or small group education and counseling on adjustment to heart disease, stress management and health-related lifestyle change. Teach and support self-help strategies.    Expected Outcomes Short Term: Participant demonstrates changes in health-related behavior, relaxation and other stress management skills, ability to obtain effective social support, and compliance with psychotropic medications if prescribed.;Long Term: Emotional wellbeing is indicated by absence of clinically significant psychosocial distress or social isolation.           Core Components/Risk Factors/Patient Goals Review:   Goals and  Risk Factor Review    Row Name 08/22/19 1224 08/30/19 1544           Core Components/Risk Factors/Patient Goals Review   Personal Goals Review Weight Management/Obesity;Hypertension;Lipids;Stress Weight Management/Obesity;Hypertension;Lipids;Stress      Review Gabor started exercise on 08/22/19 and tolerated exercise without difficulty. Lenon is doing well with exercise. Pride's vital signs and weight have been stable.      Expected Outcomes Patient will continue to partcipate in phase 2 cardiac rehab for exercise nutrtion and lifestyle modifications Nikoli will continue to participate in phase 2 cardiac rehab for exercise, nutrtion and lifestyle modifications             Core Components/Risk Factors/Patient Goals at Discharge (Final Review):   Goals and Risk Factor Review - 08/30/19 1544      Core Components/Risk Factors/Patient Goals Review   Personal Goals Review Weight Management/Obesity;Hypertension;Lipids;Stress    Review Zyen is doing well with exercise. Avrom's vital signs and weight have been stable.    Expected Outcomes Eva will continue to participate in phase 2 cardiac rehab for exercise, nutrtion and lifestyle modifications           ITP Comments:  ITP Comments    Row Name 08/16/19 0909 08/30/19 1541         ITP Comments Dr 09/01/19 MD, Medical Director 30 Day ITP Review. Tylen is off to a good start to exercise. Jeovanni's vital signs have been stable.             Comments: See ITP comments.Hessie Diener, RN,BSN 09/01/2019 3:10 PM

## 2019-08-31 ENCOUNTER — Inpatient Hospital Stay (HOSPITAL_COMMUNITY): Admission: RE | Admit: 2019-08-31 | Payer: BC Managed Care – PPO | Source: Ambulatory Visit

## 2019-08-31 ENCOUNTER — Encounter (HOSPITAL_COMMUNITY)
Admission: RE | Admit: 2019-08-31 | Discharge: 2019-08-31 | Disposition: A | Discharge status: Home / Self Care | Payer: BC Managed Care – PPO | Source: Ambulatory Visit | Attending: Cardiology | Admitting: Cardiology

## 2019-08-31 ENCOUNTER — Other Ambulatory Visit: Payer: Self-pay

## 2019-08-31 DIAGNOSIS — Z955 Presence of coronary angioplasty implant and graft: Secondary | ICD-10-CM

## 2019-09-02 ENCOUNTER — Ambulatory Visit (HOSPITAL_COMMUNITY): Payer: BC Managed Care – PPO

## 2019-09-02 ENCOUNTER — Encounter (HOSPITAL_COMMUNITY): Payer: BC Managed Care – PPO

## 2019-09-05 ENCOUNTER — Ambulatory Visit (HOSPITAL_COMMUNITY): Payer: BC Managed Care – PPO

## 2019-09-05 ENCOUNTER — Other Ambulatory Visit: Payer: Self-pay

## 2019-09-05 ENCOUNTER — Encounter (HOSPITAL_COMMUNITY)
Admission: RE | Admit: 2019-09-05 | Discharge: 2019-09-05 | Disposition: A | Discharge status: Home / Self Care | Payer: BC Managed Care – PPO | Source: Ambulatory Visit | Attending: Cardiology | Admitting: Cardiology

## 2019-09-05 DIAGNOSIS — Z955 Presence of coronary angioplasty implant and graft: Secondary | ICD-10-CM

## 2019-09-05 NOTE — Progress Notes (Signed)
Nutrition Note  Spoke with patient today about lipid management. Reviewed lipid panel - pt's latest lipid panel all WNL. Discussed how diet impacts cholesterol. Reviewed heart healthy diet. Daily recommendation of 28-40 g fiber, <16 g saturated fat, 0 g trans fat, and 1500 mg sodium. Recommended eating pattern of lean protein at every meal or snack, 2 servings fatty fish per week, 3-5 servings non starchy vegetables per day, 2-3 servings fruit per day, 1 oz nuts per day. Provided handouts and recipes. Collaborated with pt for goal setting. Pt verbalized understanding.   Andrey Campanile, MS, RDN, LDN

## 2019-09-07 ENCOUNTER — Ambulatory Visit (HOSPITAL_COMMUNITY): Payer: BC Managed Care – PPO

## 2019-09-07 ENCOUNTER — Other Ambulatory Visit: Payer: Self-pay

## 2019-09-07 ENCOUNTER — Encounter (HOSPITAL_COMMUNITY)
Admission: RE | Admit: 2019-09-07 | Discharge: 2019-09-07 | Disposition: A | Discharge status: Home / Self Care | Payer: BC Managed Care – PPO | Source: Ambulatory Visit | Attending: Cardiology | Admitting: Cardiology

## 2019-09-07 DIAGNOSIS — Z955 Presence of coronary angioplasty implant and graft: Secondary | ICD-10-CM

## 2019-09-09 ENCOUNTER — Encounter (HOSPITAL_COMMUNITY)
Admission: RE | Admit: 2019-09-09 | Discharge: 2019-09-09 | Disposition: A | Discharge status: Home / Self Care | Payer: BC Managed Care – PPO | Source: Ambulatory Visit | Attending: Cardiology | Admitting: Cardiology

## 2019-09-09 ENCOUNTER — Other Ambulatory Visit: Payer: Self-pay

## 2019-09-09 ENCOUNTER — Ambulatory Visit (HOSPITAL_COMMUNITY): Payer: BC Managed Care – PPO

## 2019-09-09 DIAGNOSIS — Z955 Presence of coronary angioplasty implant and graft: Secondary | ICD-10-CM | POA: Diagnosis not present

## 2019-09-09 DIAGNOSIS — K219 Gastro-esophageal reflux disease without esophagitis: Secondary | ICD-10-CM | POA: Diagnosis not present

## 2019-09-09 DIAGNOSIS — I10 Essential (primary) hypertension: Secondary | ICD-10-CM | POA: Diagnosis not present

## 2019-09-09 DIAGNOSIS — M1990 Unspecified osteoarthritis, unspecified site: Secondary | ICD-10-CM | POA: Diagnosis not present

## 2019-09-09 DIAGNOSIS — Z7982 Long term (current) use of aspirin: Secondary | ICD-10-CM | POA: Diagnosis not present

## 2019-09-09 DIAGNOSIS — F419 Anxiety disorder, unspecified: Secondary | ICD-10-CM | POA: Diagnosis not present

## 2019-09-09 DIAGNOSIS — Z79899 Other long term (current) drug therapy: Secondary | ICD-10-CM | POA: Diagnosis not present

## 2019-09-09 DIAGNOSIS — I2510 Atherosclerotic heart disease of native coronary artery without angina pectoris: Secondary | ICD-10-CM | POA: Insufficient documentation

## 2019-09-09 DIAGNOSIS — E785 Hyperlipidemia, unspecified: Secondary | ICD-10-CM | POA: Insufficient documentation

## 2019-09-14 ENCOUNTER — Other Ambulatory Visit: Payer: Self-pay

## 2019-09-14 ENCOUNTER — Encounter (HOSPITAL_COMMUNITY)
Admission: RE | Admit: 2019-09-14 | Discharge: 2019-09-14 | Disposition: A | Discharge status: Home / Self Care | Payer: BC Managed Care – PPO | Source: Ambulatory Visit | Attending: Cardiology | Admitting: Cardiology

## 2019-09-14 DIAGNOSIS — I10 Essential (primary) hypertension: Secondary | ICD-10-CM | POA: Diagnosis not present

## 2019-09-14 DIAGNOSIS — M1990 Unspecified osteoarthritis, unspecified site: Secondary | ICD-10-CM | POA: Diagnosis not present

## 2019-09-14 DIAGNOSIS — Z955 Presence of coronary angioplasty implant and graft: Secondary | ICD-10-CM | POA: Diagnosis not present

## 2019-09-14 DIAGNOSIS — K219 Gastro-esophageal reflux disease without esophagitis: Secondary | ICD-10-CM | POA: Diagnosis not present

## 2019-09-14 DIAGNOSIS — I2510 Atherosclerotic heart disease of native coronary artery without angina pectoris: Secondary | ICD-10-CM | POA: Diagnosis not present

## 2019-09-14 DIAGNOSIS — E785 Hyperlipidemia, unspecified: Secondary | ICD-10-CM | POA: Diagnosis not present

## 2019-09-14 DIAGNOSIS — Z79899 Other long term (current) drug therapy: Secondary | ICD-10-CM | POA: Diagnosis not present

## 2019-09-14 DIAGNOSIS — Z7982 Long term (current) use of aspirin: Secondary | ICD-10-CM | POA: Diagnosis not present

## 2019-09-14 DIAGNOSIS — F419 Anxiety disorder, unspecified: Secondary | ICD-10-CM | POA: Diagnosis not present

## 2019-09-16 ENCOUNTER — Encounter (HOSPITAL_COMMUNITY)
Admission: RE | Admit: 2019-09-16 | Discharge: 2019-09-16 | Disposition: A | Discharge status: Home / Self Care | Payer: BC Managed Care – PPO | Source: Ambulatory Visit | Attending: Cardiology | Admitting: Cardiology

## 2019-09-16 ENCOUNTER — Other Ambulatory Visit: Payer: Self-pay

## 2019-09-16 DIAGNOSIS — Z955 Presence of coronary angioplasty implant and graft: Secondary | ICD-10-CM

## 2019-09-16 DIAGNOSIS — F419 Anxiety disorder, unspecified: Secondary | ICD-10-CM | POA: Diagnosis not present

## 2019-09-16 DIAGNOSIS — Z79899 Other long term (current) drug therapy: Secondary | ICD-10-CM | POA: Diagnosis not present

## 2019-09-16 DIAGNOSIS — I2510 Atherosclerotic heart disease of native coronary artery without angina pectoris: Secondary | ICD-10-CM | POA: Diagnosis not present

## 2019-09-16 DIAGNOSIS — E785 Hyperlipidemia, unspecified: Secondary | ICD-10-CM | POA: Diagnosis not present

## 2019-09-16 DIAGNOSIS — K219 Gastro-esophageal reflux disease without esophagitis: Secondary | ICD-10-CM | POA: Diagnosis not present

## 2019-09-16 DIAGNOSIS — I10 Essential (primary) hypertension: Secondary | ICD-10-CM | POA: Diagnosis not present

## 2019-09-16 DIAGNOSIS — Z7982 Long term (current) use of aspirin: Secondary | ICD-10-CM | POA: Diagnosis not present

## 2019-09-16 DIAGNOSIS — M1990 Unspecified osteoarthritis, unspecified site: Secondary | ICD-10-CM | POA: Diagnosis not present

## 2019-09-19 ENCOUNTER — Encounter (HOSPITAL_COMMUNITY)
Admission: RE | Admit: 2019-09-19 | Discharge: 2019-09-19 | Disposition: A | Discharge status: Home / Self Care | Payer: BC Managed Care – PPO | Source: Ambulatory Visit | Attending: Cardiology | Admitting: Cardiology

## 2019-09-19 ENCOUNTER — Other Ambulatory Visit: Payer: Self-pay

## 2019-09-19 DIAGNOSIS — K219 Gastro-esophageal reflux disease without esophagitis: Secondary | ICD-10-CM | POA: Diagnosis not present

## 2019-09-19 DIAGNOSIS — I10 Essential (primary) hypertension: Secondary | ICD-10-CM | POA: Diagnosis not present

## 2019-09-19 DIAGNOSIS — Z955 Presence of coronary angioplasty implant and graft: Secondary | ICD-10-CM | POA: Diagnosis not present

## 2019-09-19 DIAGNOSIS — M1990 Unspecified osteoarthritis, unspecified site: Secondary | ICD-10-CM | POA: Diagnosis not present

## 2019-09-19 DIAGNOSIS — I2510 Atherosclerotic heart disease of native coronary artery without angina pectoris: Secondary | ICD-10-CM | POA: Diagnosis not present

## 2019-09-19 DIAGNOSIS — Z7982 Long term (current) use of aspirin: Secondary | ICD-10-CM | POA: Diagnosis not present

## 2019-09-19 DIAGNOSIS — Z79899 Other long term (current) drug therapy: Secondary | ICD-10-CM | POA: Diagnosis not present

## 2019-09-19 DIAGNOSIS — E785 Hyperlipidemia, unspecified: Secondary | ICD-10-CM | POA: Diagnosis not present

## 2019-09-19 DIAGNOSIS — F419 Anxiety disorder, unspecified: Secondary | ICD-10-CM | POA: Diagnosis not present

## 2019-09-19 NOTE — Progress Notes (Signed)
I have reviewed a Home Exercise Prescription with Gabriel Jackson . Gabriel Jackson is currently exercising at home. The patient was advised to walk 2-3 days a week for 30-45  minutes.  Gabriel Jackson and I discussed how to progress their exercise prescription.  The patient stated that their goal was to return to local gym. Encouraged pt to start back at gym before graduating from cardiac rehab. We will plan to increase workloads and exercise prescription intensity in preparation for gym. The patient stated that they understand the exercise prescription. We reviewed exercise guidelines, target heart rate during exercise, RPE Scale, weather conditions, NTG use, endpoints for exercise, warmup and cool down.  Patient is encouraged to come to me with any questions. I will continue to follow up with the patient to assist them with progression and safety.    York Cerise MS, ACSM CEP 7:23 AM

## 2019-09-21 ENCOUNTER — Other Ambulatory Visit: Payer: Self-pay

## 2019-09-21 ENCOUNTER — Encounter (HOSPITAL_COMMUNITY)
Admission: RE | Admit: 2019-09-21 | Discharge: 2019-09-21 | Disposition: A | Discharge status: Home / Self Care | Payer: BC Managed Care – PPO | Source: Ambulatory Visit | Attending: Cardiology | Admitting: Cardiology

## 2019-09-21 DIAGNOSIS — Z955 Presence of coronary angioplasty implant and graft: Secondary | ICD-10-CM

## 2019-09-21 DIAGNOSIS — I10 Essential (primary) hypertension: Secondary | ICD-10-CM | POA: Diagnosis not present

## 2019-09-21 DIAGNOSIS — I2510 Atherosclerotic heart disease of native coronary artery without angina pectoris: Secondary | ICD-10-CM | POA: Diagnosis not present

## 2019-09-21 DIAGNOSIS — K219 Gastro-esophageal reflux disease without esophagitis: Secondary | ICD-10-CM | POA: Diagnosis not present

## 2019-09-21 DIAGNOSIS — Z7982 Long term (current) use of aspirin: Secondary | ICD-10-CM | POA: Diagnosis not present

## 2019-09-21 DIAGNOSIS — M1990 Unspecified osteoarthritis, unspecified site: Secondary | ICD-10-CM | POA: Diagnosis not present

## 2019-09-21 DIAGNOSIS — F419 Anxiety disorder, unspecified: Secondary | ICD-10-CM | POA: Diagnosis not present

## 2019-09-21 DIAGNOSIS — E785 Hyperlipidemia, unspecified: Secondary | ICD-10-CM | POA: Diagnosis not present

## 2019-09-21 DIAGNOSIS — Z79899 Other long term (current) drug therapy: Secondary | ICD-10-CM | POA: Diagnosis not present

## 2019-09-23 ENCOUNTER — Other Ambulatory Visit: Payer: Self-pay

## 2019-09-23 ENCOUNTER — Encounter (HOSPITAL_COMMUNITY)
Admission: RE | Admit: 2019-09-23 | Discharge: 2019-09-23 | Disposition: A | Discharge status: Home / Self Care | Payer: BC Managed Care – PPO | Source: Ambulatory Visit | Attending: Cardiology | Admitting: Cardiology

## 2019-09-23 DIAGNOSIS — E785 Hyperlipidemia, unspecified: Secondary | ICD-10-CM | POA: Diagnosis not present

## 2019-09-23 DIAGNOSIS — K219 Gastro-esophageal reflux disease without esophagitis: Secondary | ICD-10-CM | POA: Diagnosis not present

## 2019-09-23 DIAGNOSIS — I2510 Atherosclerotic heart disease of native coronary artery without angina pectoris: Secondary | ICD-10-CM | POA: Diagnosis not present

## 2019-09-23 DIAGNOSIS — Z79899 Other long term (current) drug therapy: Secondary | ICD-10-CM | POA: Diagnosis not present

## 2019-09-23 DIAGNOSIS — Z955 Presence of coronary angioplasty implant and graft: Secondary | ICD-10-CM

## 2019-09-23 DIAGNOSIS — M1990 Unspecified osteoarthritis, unspecified site: Secondary | ICD-10-CM | POA: Diagnosis not present

## 2019-09-23 DIAGNOSIS — F419 Anxiety disorder, unspecified: Secondary | ICD-10-CM | POA: Diagnosis not present

## 2019-09-23 DIAGNOSIS — I10 Essential (primary) hypertension: Secondary | ICD-10-CM | POA: Diagnosis not present

## 2019-09-23 DIAGNOSIS — Z7982 Long term (current) use of aspirin: Secondary | ICD-10-CM | POA: Diagnosis not present

## 2019-09-26 ENCOUNTER — Encounter (HOSPITAL_COMMUNITY)
Admission: RE | Admit: 2019-09-26 | Discharge: 2019-09-26 | Disposition: A | Discharge status: Home / Self Care | Payer: BC Managed Care – PPO | Source: Ambulatory Visit | Attending: Cardiology | Admitting: Cardiology

## 2019-09-26 ENCOUNTER — Other Ambulatory Visit: Payer: Self-pay

## 2019-09-26 DIAGNOSIS — Z955 Presence of coronary angioplasty implant and graft: Secondary | ICD-10-CM

## 2019-09-26 DIAGNOSIS — F419 Anxiety disorder, unspecified: Secondary | ICD-10-CM | POA: Diagnosis not present

## 2019-09-26 DIAGNOSIS — Z7982 Long term (current) use of aspirin: Secondary | ICD-10-CM | POA: Diagnosis not present

## 2019-09-26 DIAGNOSIS — K219 Gastro-esophageal reflux disease without esophagitis: Secondary | ICD-10-CM | POA: Diagnosis not present

## 2019-09-26 DIAGNOSIS — M1990 Unspecified osteoarthritis, unspecified site: Secondary | ICD-10-CM | POA: Diagnosis not present

## 2019-09-26 DIAGNOSIS — Z79899 Other long term (current) drug therapy: Secondary | ICD-10-CM | POA: Diagnosis not present

## 2019-09-26 DIAGNOSIS — I2510 Atherosclerotic heart disease of native coronary artery without angina pectoris: Secondary | ICD-10-CM | POA: Diagnosis not present

## 2019-09-26 DIAGNOSIS — I10 Essential (primary) hypertension: Secondary | ICD-10-CM | POA: Diagnosis not present

## 2019-09-26 DIAGNOSIS — E785 Hyperlipidemia, unspecified: Secondary | ICD-10-CM | POA: Diagnosis not present

## 2019-09-27 NOTE — Progress Notes (Signed)
Cardiac Individual Treatment Plan  Patient Details  Name: Gabriel Jackson MRN: [id number] Date of Birth: [birth year] Referring Provider:     CARDIAC REHAB PHASE II ORIENTATION from 08/16/2019 in [facility] CARDIAC [facility]  Referring Provider Mayford Knife, [place]      Initial Encounter Date:    CARDIAC REHAB PHASE II ORIENTATION from 08/16/2019 in [facility] CARDIAC REHAB  Date 08/16/19      Visit Diagnosis: S/P DES LAD 06/06/19  Patient's Home Medications on Admission:  Current Outpatient Medications:    amLODipine (NORVASC) 5 MG tablet, Take 1 tablet (5 mg total) by mouth daily., Disp: 90 tablet, Rfl: 3   aspirin EC 81 MG tablet, Take 1 tablet (81 mg total) by mouth daily., Disp: 30 tablet, Rfl: 11   atorvastatin (LIPITOR) 80 MG tablet, Take 1 tablet (80 mg total) by mouth daily at 6 PM., Disp: 90 tablet, Rfl: 3   gabapentin (NEURONTIN) 300 MG capsule, Take 300 mg by mouth at bedtime., Disp: , Rfl:    Glucosamine HCl (GLUCOSAMINE PO), Take 2 tablets by mouth daily. , Disp: , Rfl:    metoprolol tartrate (LOPRESSOR) 25 MG tablet, Take 1 tablet (25 mg total) by mouth 2 (two) times daily., Disp: 60 tablet, Rfl: 11   Multiple Vitamin (MULTIVITAMIN) capsule, Take 1 capsule by mouth daily., Disp: , Rfl:    Multiple Vitamins-Minerals (EMERGEN-C IMMUNE PO), Take 1 tablet by mouth daily., Disp: , Rfl:    nitroGLYCERIN (NITROSTAT) 0.4 MG SL tablet, Place 1 tablet (0.4 mg total) under the tongue every 5 (five) minutes x 3 doses as needed for chest pain. (Patient not taking: Reported on 08/06/2019), Disp: 30 tablet, Rfl: 3   olmesartan (BENICAR) 40 MG tablet, Take 1 tablet (40 mg total) by mouth daily., Disp: 90 tablet, Rfl: 3   omeprazole (PRILOSEC) 20 MG capsule, Take 20 mg by mouth daily., Disp: , Rfl:    prasugrel (EFFIENT) 10 MG TABS tablet, Take 1 tablet (10 mg total) by mouth daily., Disp: 30 tablet, Rfl: 11  Past Medical History: Past Medical History:    Diagnosis Date   Anxiety    CAD (coronary artery disease)    Cath 06/06/19 (admx w [place]) >> mLAD 95, 60, 30; pRCA 30, mRCA 30 >> PCI:  DES to mLAD, D2 jailed by stent // Echo 05/2019: EF 60-65, mild asymmetric LVH, trivial MR, normal RVSf    GERD (gastroesophageal reflux disease)    HTN (hypertension)    Hyperlipidemia    OA (osteoarthritis)     Tobacco Use: Social History   Tobacco Use  Smoking Status Never Smoker  Smokeless Tobacco Never Used    Labs: Recent Review Advice worker    Labs for ITP Cardiac and Pulmonary Rehab Latest Ref Rng & Units 06/03/2019 08/16/2019   Cholestrol 100 - 199 mg/dL 409 81(X)   LDLCALC 0 - 99 mg/dL 914(N) 37   HDL >82 mg/dL 95(A) 21(H)   Trlycerides 0 - 149 mg/dL 66 72   Hemoglobin Y8M 4.8 - 5.6 % 5.4 -      Capillary Blood Glucose: No results found for: GLUCAP   Exercise Target Goals: Exercise Program Goal: Individual exercise prescription set using results from initial 6 min walk test and THRR while considering  patients activity barriers and safety.   Exercise Prescription Goal: Starting with aerobic activity 30 plus minutes a day, 3 days per week for initial exercise prescription. Provide home exercise prescription and guidelines that participant acknowledges understanding  prior to discharge.  Activity Barriers & Risk Stratification:  Activity Barriers & Cardiac Risk Stratification - 08/16/19 1000      Activity Barriers & Cardiac Risk Stratification   Activity Barriers None    Cardiac Risk Stratification Low           6 Minute Walk:  6 Minute Walk    Row Name 08/16/19 0958         6 Minute Walk   Distance 1990 feet     Walk Time 6 minutes     # of Rest Breaks 0     MPH 3.76     METS 4.96     RPE 12     VO2 Peak 17.38     Resting HR 89 bpm     Resting BP 122/60     Resting Oxygen Saturation  98 %     Exercise Oxygen Saturation  during 6 min walk 98 %     Max Ex. HR 122 bpm     Max Ex. BP 142/80     2 Minute  Post BP 122/76            Oxygen Initial Assessment:   Oxygen Re-Evaluation:   Oxygen Discharge (Final Oxygen Re-Evaluation):   Initial Exercise Prescription:  Initial Exercise Prescription - 08/16/19 1000      Date of Initial Exercise RX and Referring Provider   Date 08/16/19    Referring Provider Armanda Magic    Expected Discharge Date 10/14/19      Treadmill   MPH 3.2    Grade 0    Minutes 15    METs 3.45      NuStep   Level 3    SPM 70    Minutes 15    METs 2.5      Prescription Details   Frequency (times per week) 3    Duration Progress to 30 minutes of continuous aerobic without signs/symptoms of physical distress      Intensity   THRR 40-80% of Max Heartrate 65-130    Ratings of Perceived Exertion 11-13      Progression   Progression Continue progressive overload as per policy without signs/symptoms or physical distress.      Resistance Training   Training Prescription Yes    Weight 4    Reps 10-15           Perform Capillary Blood Glucose checks as needed.  Exercise Prescription Changes:   Exercise Prescription Changes    Row Name 08/22/19 1400 08/31/19 1318 09/14/19 0712 09/28/19 1009       Response to Exercise   Blood Pressure (Admit) 112/70 132/80 122/70 140/70    Blood Pressure (Exercise) 124/70 128/72 130/82 170/82    Blood Pressure (Exit) 118/70 104/70 110/70 126/78    Heart Rate (Admit) 71 bpm 80 bpm 79 bpm 74 bpm    Heart Rate (Exercise) 98 bpm 117 bpm 123 bpm 110 bpm    Heart Rate (Exit) 64 bpm 88 bpm 88 bpm 82 bpm    Rating of Perceived Exertion (Exercise) 11 12 12 11     Perceived Dyspnea (Exercise) 0 0 0 0    Symptoms None None None None    Comments Pt's first day of exercise  None Added Upright SciFit Bike to prescription Pt's BP elevated during session    Duration Progress to 10 minutes continuous walking  at current work load and total walking time to 30-45 min Progress to 10 minutes continuous  walking  at current work  load and total walking time to 30-45 min Continue with 30 min of aerobic exercise without signs/symptoms of physical distress. Continue with 30 min of aerobic exercise without signs/symptoms of physical distress.    Intensity THRR unchanged THRR unchanged THRR unchanged THRR unchanged      Progression   Progression Continue to progress workloads to maintain intensity without signs/symptoms of physical distress. Continue to progress workloads to maintain intensity without signs/symptoms of physical distress. Continue to progress workloads to maintain intensity without signs/symptoms of physical distress. Continue to progress workloads to maintain intensity without signs/symptoms of physical distress.    Average METs 2.7 3.8 4.1 5.3      Resistance Training   Training Prescription Yes No Yes No    Weight 4lbs -- 8lbs --    Reps 10-15 -- 10-15 --    Time 10 Minutes -- 10 Minutes --      Treadmill   MPH 2.7 3.2 -- --    Grade 0 2 -- --    Minutes 15 15 -- --    METs 3.07 4.33 -- --      Recumbant Bike   Level -- -- 3.5 3.8    Minutes -- -- 15 15    METs -- -- 4.1 6.8      NuStep   Level 3 5 6 7     SPM 85 95 100 100    Minutes 15 15 15 15     METs 2.4 3.2 4.2 3.7      Home Exercise Plan   Plans to continue exercise at -- -- (comment)  [facility]    Frequency -- -- Add 2 additional days to program exercise sessions. Add 2 additional days to program exercise sessions.    Initial Home Exercises Provided -- -- 09/09/19 09/09/19           Exercise Comments:   Exercise Comments    Row Name 08/22/19 1428 09/19/19 0716         Exercise Comments Pt's first day of exercise. Pt responded well to exercise prescription. Will continue to monitor and progress pt as tolerated. Reviewed Home Exercise Plan with pt. Discussed how to safely progress workloads while at local gym. Pt planning to increase intensity at  cardiac rehab to help with confidence for unmonitored gym workout. Pt verbalized understanding.             Exercise Goals and Review:   Exercise Goals    Row Name 08/16/19 1001             Exercise Goals   Increase Physical Activity Yes       Intervention Provide advice, education, support and counseling about physical activity/exercise needs.;Develop an individualized exercise prescription for aerobic and resistive training based on initial evaluation findings, risk stratification, comorbidities and participant's personal goals.       Expected Outcomes Short Term: Attend rehab on a regular basis to increase amount of physical activity.;Long Term: Add in home exercise to make exercise part of routine and to increase amount of physical activity.;Long Term: Exercising regularly at least 3-5 days a week.       Increase Strength and Stamina Yes       Intervention Provide advice, education, support and counseling about physical activity/exercise needs.;Develop an individualized exercise prescription for aerobic and resistive training based on initial evaluation findings, risk stratification, comorbidities and participant's personal  goals.       Expected Outcomes Short Term: Increase workloads from initial exercise prescription for resistance, speed, and METs.;Short Term: Perform resistance training exercises routinely during rehab and add in resistance training at home;Long Term: Improve cardiorespiratory fitness, muscular endurance and strength as measured by increased METs and functional capacity ( )       Able to understand and use rate of perceived exertion (RPE) scale Yes       Intervention Provide education and explanation on how to use RPE scale       Expected Outcomes Short Term: Able to use RPE daily in rehab to express subjective intensity level;Long Term:  Able to use RPE to guide intensity level when exercising independently       Knowledge and understanding of Target Heart Rate  Range (THRR) Yes       Intervention Provide education and explanation of THRR including how the numbers were predicted and where they are located for reference       Expected Outcomes Short Term: Able to state/look up THRR;Short Term: Able to use daily as guideline for intensity in rehab;Long Term: Able to use THRR to govern intensity when exercising independently       Able to check pulse independently Yes       Intervention Provide education and demonstration on how to check pulse in carotid and radial arteries.;Review the importance of being able to check your own pulse for safety during independent exercise       Expected Outcomes Short Term: Able to explain why pulse checking is important during independent exercise;Long Term: Able to check pulse independently and accurately       Understanding of Exercise Prescription Yes       Intervention Provide education, explanation, and written materials on patient's individual exercise prescription       Expected Outcomes Short Term: Able to explain program exercise prescription;Long Term: Able to explain home exercise prescription to exercise independently              Exercise Goals Re-Evaluation :  Exercise Goals Re-Evaluation    Row Name 08/22/19 1430 09/19/19 0718           Exercise Goal Re-Evaluation   Exercise Goals Review Increase Strength and Stamina;Knowledge and understanding of Target Heart Rate Range (THRR);Understanding of Exercise Prescription Increase Strength and Stamina;Knowledge and understanding of Target Heart Rate Range (THRR);Understanding of Exercise Prescription;Increase Physical Activity;Able to understand and use rate of perceived exertion (RPE) scale;Able to check pulse independently      Comments Pt's first day of exercise. Oriented pt to equipment. Will continue to monitor. Reviewed HEP with pt. Discussed RPE Scale, THRR, NTG use (pt has not been carrying it with him), weather precuations, endpoints of exercise,  warmup and cool down. We also discussed adding recumbent bike and elliptical to his exercise prescription. Pt responded well to changes, however pt will not exercise on ellipitcal due to previous knee pain.      Expected Outcomes Pt will continue to increase strength and stamina. Pt will plan to exercise 2-3 days at home for 30-45 minutes. Pt will continue to increase cardiovascular fitness.              Discharge Exercise Prescription (Final Exercise Prescription Changes):  Exercise Prescription Changes - 09/28/19 1009      Response to Exercise   Blood Pressure (Admit) 140/70    Blood Pressure (Exercise) 170/82    Blood Pressure (Exit) 126/78    Heart Rate (  Admit) 74 bpm    Heart Rate (Exercise) 110 bpm    Heart Rate (Exit) 82 bpm    Rating of Perceived Exertion (Exercise) 11    Perceived Dyspnea (Exercise) 0    Symptoms None    Comments Pt's BP elevated during session    Duration Continue with 30 min of aerobic exercise without signs/symptoms of physical distress.    Intensity THRR unchanged      Progression   Progression Continue to progress workloads to maintain intensity without signs/symptoms of physical distress.    Average METs 5.3      Resistance Training   Training Prescription No      Recumbant Bike   Level 3.8    Minutes 15    METs 6.8      NuStep   Level 7    SPM 100    Minutes 15    METs 3.7      Home Exercise Plan   Plans to continue exercise at [facility] (comment)   [facility]   Frequency Add 2 additional days to program exercise sessions.    Initial Home Exercises Provided 09/09/19           Nutrition:  Target Goals: Understanding of nutrition guidelines, daily intake of sodium 1500mg , cholesterol 200mg , calories 30% from fat and 7% or less from saturated fats, daily to have 5 or more servings of fruits and vegetables.  Biometrics:  Pre Biometrics - 08/16/19 1112      Pre Biometrics   Height 5' 11.25" (1.81 m)    Weight  99.7 kg    Waist Circumference 42 inches    Hip Circumference 42.5 inches    Waist to Hip Ratio 0.99 %    BMI (Calculated) 30.43    Triceps Skinfold 12 mm    % Body Fat 27.9 %    Grip Strength 42.5 kg    Flexibility 13 in    Single Leg Stand 87.25 seconds            Nutrition Therapy Plan and Nutrition Goals:  Nutrition Therapy & Goals - 09/01/19 0819      Nutrition Therapy   Diet Heart healthy    Drug/Food Interactions Statins/Certain Fruits      Personal Nutrition Goals   Nutrition Goal Pt to build a healthy plate including vegetables, fruits, whole grains, and low-fat dairy products in a heart healthy meal plan.      Intervention Plan   Intervention Prescribe, educate and counsel regarding individualized specific dietary modifications aiming towards targeted core components such as weight, hypertension, lipid management, diabetes, heart failure and other comorbidities.;Nutrition handout(s) given to patient.    Expected Outcomes Short Term Goal: Understand basic principles of dietary content, such as calories, fat, sodium, cholesterol and nutrients.           Nutrition Assessments:   Nutrition Goals Re-Evaluation:  Nutrition Goals Re-Evaluation    Row Name 09/01/19 0820 09/20/19 0941           Goals   Current Weight 219 lb 12.8 oz (99.7 kg) 219 lb 12.8 oz (99.7 kg)      Nutrition Goal Pt to build a healthy plate including vegetables, fruits, whole grains, and low-fat dairy products in a heart healthy meal plan. Pt to build a healthy plate including vegetables, fruits, whole grains, and low-fat dairy products in a heart healthy meal plan.             Nutrition Goals Discharge (  Final Nutrition Goals Re-Evaluation):  Nutrition Goals Re-Evaluation - 09/20/19 0941      Goals   Current Weight 219 lb 12.8 oz (99.7 kg)    Nutrition Goal Pt to build a healthy plate including vegetables, fruits, whole grains, and low-fat dairy products in a heart healthy meal plan.            Psychosocial: Target Goals: Acknowledge presence or absence of significant depression and/or stress, maximize coping skills, provide positive support system. Participant is able to verbalize types and ability to use techniques and skills needed for reducing stress and depression.  Initial Review & Psychosocial Screening:  Initial Psych Review & Screening - 08/16/19 1032      Initial Review   Current issues with Current Stress Concerns    Source of Stress Concerns Family    Comments Stiles's wife has recently been in the hospital he is concerned about her health      Family Dynamics   Good Support System? Yes   Hessie Dienerlan has his wife and daughter for support     Barriers   Psychosocial barriers to participate in program The patient should benefit from training in stress management and relaxation.      Screening Interventions   Interventions Encouraged to exercise;To provide support and resources with identified psychosocial needs;Provide feedback about the scores to participant    Expected Outcomes Long Term Goal: Stressors or current issues are controlled or eliminated.;Short Term goal: Identification and review with participant of any Quality of Life or Depression concerns found by scoring the questionnaire.;Long Term goal: The participant improves quality of Life and PHQ9 Scores as seen by post scores and/or verbalization of changes           Quality of Life Scores:  Quality of Life - 08/16/19 1109      Quality of Life   Select Quality of Life      Quality of Life Scores   Health/Function Pre 22.7 %    Socioeconomic Pre 24 %    Psych/Spiritual Pre 23.29 %    Family Pre 20.4 %    GLOBAL Pre 22.75 %          Scores of 19 and below usually indicate a poorer quality of life in these areas.  A difference of  2-3 points is a clinically meaningful difference.  A difference of 2-3 points in the total score of the Quality of Life Index has been associated with significant  improvement in overall quality of life, self-image, physical symptoms, and general health in studies assessing change in quality of life.  PHQ-9: Recent Review Flowsheet Data    [facility]   Decreased Interest 0    Down, Depressed, Hopeless 0   PHQ - 2 Score 0     Interpretation of Total Score  Total Score Depression Severity:  1-4 = Minimal depression, 5-9 = Mild depression, 10-14 = Moderate depression, 15-19 = Moderately severe depression, 20-27 = Severe depression   Psychosocial Evaluation and Intervention:   Psychosocial Re-Evaluation:  Psychosocial Re-Evaluation    Row Name 08/30/19 1542 09/27/19 1624           Psychosocial Re-Evaluation   Current issues with Current Stress Concerns Current Stress Concerns      Comments Izik's continues to have some stress concerns regarding his family/ wife who is at home and doing better Kaedin's continues to have some stress concerns regarding his family/ wife who is at home and doing better  Expected Outcomes Will continue to monitor and offer support as needed Will continue to monitor and offer support as needed      Interventions Encouraged to attend Cardiac Rehabilitation for the exercise;Stress management education Encouraged to attend Cardiac Rehabilitation for the exercise;Stress management education      Comments -- Jasher's wife is doing better. Patient reports having less stress        Initial Review   Source of Stress Concerns Family Family             Psychosocial Discharge (Final Psychosocial Re-Evaluation):  Psychosocial Re-Evaluation - 09/27/19 1624      Psychosocial Re-Evaluation   Current issues with Current Stress Concerns    Comments Shreyas's continues to have some stress concerns regarding his family/ wife who is at home and doing better    Expected Outcomes Will continue to monitor and offer support as needed    Interventions Encouraged to attend Cardiac Rehabilitation for the  exercise;Stress management education    Comments Jovian's wife is doing better. Patient reports having less stress      Initial Review   Source of Stress Concerns Family           Vocational Rehabilitation: Provide vocational rehab assistance to qualifying candidates.   Vocational Rehab Evaluation & Intervention:  Vocational Rehab - 08/16/19 0910      Initial Vocational Rehab Evaluation & Intervention   Assessment shows need for Vocational Rehabilitation No   Jacey works full time and does not need vocational rehab at this time          Education: Education Goals: Education classes will be provided on a weekly basis, covering required topics. Participant will state understanding/return demonstration of topics presented.  Learning Barriers/Preferences:  Learning Barriers/Preferences - 08/16/19 1125      Learning Barriers/Preferences   Learning Barriers None    Learning Preferences Written Material;Skilled Demonstration           Education Topics: Hypertension, Hypertension Reduction -Define heart disease and high blood pressure. Discus how high blood pressure affects the body and ways to reduce high blood pressure.   Exercise and Your Heart -Discuss why it is important to exercise, the FITT principles of exercise, normal and abnormal responses to exercise, and how to exercise safely.   Angina -Discuss definition of angina, causes of angina, treatment of angina, and how to decrease risk of having angina.   Cardiac Medications -Review what the following cardiac medications are used for, how they affect the body, and side effects that may occur when taking the medications.  Medications include Aspirin, Beta blockers, calcium channel blockers, ACE Inhibitors, angiotensin receptor blockers, diuretics, digoxin, and antihyperlipidemics.   Congestive Heart Failure -Discuss the definition of CHF, how to live with CHF, the signs and symptoms of CHF, and how keep track of  weight and sodium intake.   Heart Disease and Intimacy -Discus the effect sexual activity has on the heart, how changes occur during intimacy as we age, and safety during sexual activity.   Smoking Cessation / COPD -Discuss different methods to quit smoking, the health benefits of quitting smoking, and the definition of COPD.   Nutrition I: Fats -Discuss the types of cholesterol, what cholesterol does to the heart, and how cholesterol levels can be controlled.   Nutrition II: Labels -Discuss the different components of food labels and how to read food label   Heart Parts/Heart Disease and PAD -Discuss the anatomy of the heart, the pathway of blood circulation through  the heart, and these are affected by heart disease.   Stress I: Signs and Symptoms -Discuss the causes of stress, how stress may lead to anxiety and depression, and ways to limit stress.   Stress II: Relaxation -Discuss different types of relaxation techniques to limit stress.   Warning Signs of Stroke / TIA -Discuss definition of a stroke, what the signs and symptoms are of a stroke, and how to identify when someone is having stroke.   Knowledge Questionnaire Score:  Knowledge Questionnaire Score - 08/16/19 1110      Knowledge Questionnaire Score   Pre Score 19/24           Core Components/Risk Factors/Patient Goals at Admission:  Personal Goals and Risk Factors at Admission - 08/16/19 1111      Core Components/Risk Factors/Patient Goals on Admission    Weight Management Yes;Obesity;Weight Loss    Intervention Weight Management: Develop a combined nutrition and exercise program designed to reach desired caloric intake, while maintaining appropriate intake of nutrient and fiber, sodium and fats, and appropriate energy expenditure required for the weight goal.;Weight Management: Provide education and appropriate resources to help participant work on and attain dietary goals.;Weight Management/Obesity:  Establish reasonable short term and long term weight goals.;Obesity: Provide education and appropriate resources to help participant work on and attain dietary goals.    Admit Weight 219 lb 4.8 oz (99.5 kg)    Goal Weight: Short Term 5 lb (2.268 kg)    Goal Weight: Long Term 20 lb (9.072 kg)    Expected Outcomes Short Term: Continue to assess and modify interventions until short term weight is achieved;Long Term: Adherence to nutrition and physical activity/exercise program aimed toward attainment of established weight goal;Weight Loss: Understanding of general recommendations for a balanced deficit meal plan, which promotes 1-2 lb weight loss per week and includes a negative energy balance of [phone] kcal/d;Understanding recommendations for meals to include 15-35% energy as protein, 25-35% energy from fat, 35-60% energy from carbohydrates, less than  of dietary cholesterol, 20-35 gm of total fiber daily;Understanding of distribution of calorie intake throughout the day with the consumption of 4-5 meals/snacks    Hypertension Yes    Intervention Provide education on lifestyle modifcations including regular physical activity/exercise, weight management, moderate sodium restriction and increased consumption of fresh fruit, vegetables, and low fat dairy, alcohol moderation, and smoking cessation.;Monitor prescription use compliance.    Expected Outcomes Short Term: Continued assessment and intervention until BP is < 140/59mm HG in hypertensive participants. < 130/29mm HG in hypertensive participants with diabetes, heart failure or chronic kidney disease.;Long Term: Maintenance of blood pressure at goal levels.    Lipids Yes    Intervention Provide education and support for participant on nutrition & aerobic/resistive exercise along with prescribed medications to achieve LDL 70mg , HDL >40mg .    Expected Outcomes Short Term: Participant states understanding of desired cholesterol values and is compliant  with medications prescribed. Participant is following exercise prescription and nutrition guidelines.;Long Term: Cholesterol controlled with medications as prescribed, with individualized exercise RX and with personalized nutrition plan. Value goals: LDL < , HDL > 40 mg.    Stress Yes    Intervention Offer individual and/or small group education and counseling on adjustment to heart disease, stress management and health-related lifestyle change. Teach and support self-help strategies.    Expected Outcomes Short Term: Participant demonstrates changes in health-related behavior, relaxation and other stress management skills, ability to obtain effective social support, and compliance with psychotropic medications if prescribed.;Long Term:  Emotional wellbeing is indicated by absence of clinically significant psychosocial distress or social isolation.           Core Components/Risk Factors/Patient Goals Review:   Goals and Risk Factor Review    Row Name 08/22/19 1224 08/30/19 1544 09/27/19 1620         Core Components/Risk Factors/Patient Goals Review   Personal Goals Review Weight Management/Obesity;Hypertension;Lipids;Stress Weight Management/Obesity;Hypertension;Lipids;Stress Weight Management/Obesity;Hypertension;Lipids;Stress     Review Zacheriah started exercise on 08/22/19 and tolerated exercise without difficulty. Carlester is doing well with exercise. Dawsen's vital signs and weight have been stable. Bay continues to do well with exercise. Korbin has some intermittent exertional blood pressure elevations. Resting BP's WNL.     Expected Outcomes Patient will continue to partcipate in phase 2 cardiac rehab for exercise nutrtion and lifestyle modifications Neshawn will continue to participate in phase 2 cardiac rehab for exercise, nutrtion and lifestyle modifications Jeven will continue to participate in phase 2 cardiac rehab for exercise, nutrtion and lifestyle modifications            Core  Components/Risk Factors/Patient Goals at Discharge (Final Review):   Goals and Risk Factor Review - 09/27/19 1620      Core Components/Risk Factors/Patient Goals Review   Personal Goals Review Weight Management/Obesity;Hypertension;Lipids;Stress    Review Joven continues to do well with exercise. Lavonte has some intermittent exertional blood pressure elevations. Resting BP's WNL.    Expected Outcomes Basil will continue to participate in phase 2 cardiac rehab for exercise, nutrtion and lifestyle modifications           ITP Comments:  ITP Comments    Row Name 08/16/19 0909 08/30/19 1541 09/27/19 1620       ITP Comments Dr Armanda Magic MD, Medical Director 30 Day ITP Review. Elfego is off to a good start to exercise. Tres's vital signs have been stable. 30 Day ITP Review. Kosta is with good attendance and participation in phase 2 cardiac rehab            Comments: See ITP comments.Gladstone Lighter, RN,BSN 09/29/2019 12:04 PM

## 2019-09-28 ENCOUNTER — Encounter (HOSPITAL_COMMUNITY)
Admission: RE | Admit: 2019-09-28 | Discharge: 2019-09-28 | Disposition: A | Discharge status: Home / Self Care | Payer: BC Managed Care – PPO | Source: Ambulatory Visit | Attending: Cardiology | Admitting: Cardiology

## 2019-09-28 ENCOUNTER — Other Ambulatory Visit: Payer: Self-pay

## 2019-09-28 DIAGNOSIS — M1990 Unspecified osteoarthritis, unspecified site: Secondary | ICD-10-CM | POA: Diagnosis not present

## 2019-09-28 DIAGNOSIS — Z79899 Other long term (current) drug therapy: Secondary | ICD-10-CM | POA: Diagnosis not present

## 2019-09-28 DIAGNOSIS — K219 Gastro-esophageal reflux disease without esophagitis: Secondary | ICD-10-CM | POA: Diagnosis not present

## 2019-09-28 DIAGNOSIS — F419 Anxiety disorder, unspecified: Secondary | ICD-10-CM | POA: Diagnosis not present

## 2019-09-28 DIAGNOSIS — E785 Hyperlipidemia, unspecified: Secondary | ICD-10-CM | POA: Diagnosis not present

## 2019-09-28 DIAGNOSIS — Z955 Presence of coronary angioplasty implant and graft: Secondary | ICD-10-CM | POA: Diagnosis not present

## 2019-09-28 DIAGNOSIS — I2510 Atherosclerotic heart disease of native coronary artery without angina pectoris: Secondary | ICD-10-CM | POA: Diagnosis not present

## 2019-09-28 DIAGNOSIS — I10 Essential (primary) hypertension: Secondary | ICD-10-CM | POA: Diagnosis not present

## 2019-09-28 DIAGNOSIS — Z7982 Long term (current) use of aspirin: Secondary | ICD-10-CM | POA: Diagnosis not present

## 2019-09-30 ENCOUNTER — Other Ambulatory Visit: Payer: Self-pay

## 2019-09-30 ENCOUNTER — Encounter (HOSPITAL_COMMUNITY)
Admission: RE | Admit: 2019-09-30 | Discharge: 2019-09-30 | Disposition: A | Discharge status: Home / Self Care | Payer: BC Managed Care – PPO | Source: Ambulatory Visit | Attending: Cardiology | Admitting: Cardiology

## 2019-09-30 DIAGNOSIS — E785 Hyperlipidemia, unspecified: Secondary | ICD-10-CM | POA: Diagnosis not present

## 2019-09-30 DIAGNOSIS — Z7982 Long term (current) use of aspirin: Secondary | ICD-10-CM | POA: Diagnosis not present

## 2019-09-30 DIAGNOSIS — I10 Essential (primary) hypertension: Secondary | ICD-10-CM | POA: Diagnosis not present

## 2019-09-30 DIAGNOSIS — F419 Anxiety disorder, unspecified: Secondary | ICD-10-CM | POA: Diagnosis not present

## 2019-09-30 DIAGNOSIS — Z955 Presence of coronary angioplasty implant and graft: Secondary | ICD-10-CM

## 2019-09-30 DIAGNOSIS — I2510 Atherosclerotic heart disease of native coronary artery without angina pectoris: Secondary | ICD-10-CM | POA: Diagnosis not present

## 2019-09-30 DIAGNOSIS — K219 Gastro-esophageal reflux disease without esophagitis: Secondary | ICD-10-CM | POA: Diagnosis not present

## 2019-09-30 DIAGNOSIS — M1990 Unspecified osteoarthritis, unspecified site: Secondary | ICD-10-CM | POA: Diagnosis not present

## 2019-09-30 DIAGNOSIS — Z79899 Other long term (current) drug therapy: Secondary | ICD-10-CM | POA: Diagnosis not present

## 2019-10-03 ENCOUNTER — Encounter (HOSPITAL_COMMUNITY)
Admission: RE | Admit: 2019-10-03 | Discharge: 2019-10-03 | Disposition: A | Discharge status: Home / Self Care | Payer: BC Managed Care – PPO | Source: Ambulatory Visit | Attending: Cardiology | Admitting: Cardiology

## 2019-10-03 ENCOUNTER — Other Ambulatory Visit: Payer: Self-pay

## 2019-10-03 DIAGNOSIS — M1990 Unspecified osteoarthritis, unspecified site: Secondary | ICD-10-CM | POA: Diagnosis not present

## 2019-10-03 DIAGNOSIS — K219 Gastro-esophageal reflux disease without esophagitis: Secondary | ICD-10-CM | POA: Diagnosis not present

## 2019-10-03 DIAGNOSIS — E785 Hyperlipidemia, unspecified: Secondary | ICD-10-CM | POA: Diagnosis not present

## 2019-10-03 DIAGNOSIS — Z955 Presence of coronary angioplasty implant and graft: Secondary | ICD-10-CM

## 2019-10-03 DIAGNOSIS — Z79899 Other long term (current) drug therapy: Secondary | ICD-10-CM | POA: Diagnosis not present

## 2019-10-03 DIAGNOSIS — I2510 Atherosclerotic heart disease of native coronary artery without angina pectoris: Secondary | ICD-10-CM | POA: Diagnosis not present

## 2019-10-03 DIAGNOSIS — Z7982 Long term (current) use of aspirin: Secondary | ICD-10-CM | POA: Diagnosis not present

## 2019-10-03 DIAGNOSIS — I10 Essential (primary) hypertension: Secondary | ICD-10-CM | POA: Diagnosis not present

## 2019-10-03 DIAGNOSIS — F419 Anxiety disorder, unspecified: Secondary | ICD-10-CM | POA: Diagnosis not present

## 2019-10-05 ENCOUNTER — Encounter (HOSPITAL_COMMUNITY)
Admission: RE | Admit: 2019-10-05 | Discharge: 2019-10-05 | Disposition: A | Discharge status: Home / Self Care | Payer: BC Managed Care – PPO | Source: Ambulatory Visit | Attending: Cardiology | Admitting: Cardiology

## 2019-10-05 ENCOUNTER — Other Ambulatory Visit: Payer: Self-pay

## 2019-10-05 VITALS — Ht 71.25 in | Wt 220.2 lb

## 2019-10-05 DIAGNOSIS — I2510 Atherosclerotic heart disease of native coronary artery without angina pectoris: Secondary | ICD-10-CM | POA: Diagnosis not present

## 2019-10-05 DIAGNOSIS — E785 Hyperlipidemia, unspecified: Secondary | ICD-10-CM | POA: Diagnosis not present

## 2019-10-05 DIAGNOSIS — K219 Gastro-esophageal reflux disease without esophagitis: Secondary | ICD-10-CM | POA: Diagnosis not present

## 2019-10-05 DIAGNOSIS — M1990 Unspecified osteoarthritis, unspecified site: Secondary | ICD-10-CM | POA: Diagnosis not present

## 2019-10-05 DIAGNOSIS — Z7982 Long term (current) use of aspirin: Secondary | ICD-10-CM | POA: Diagnosis not present

## 2019-10-05 DIAGNOSIS — Z955 Presence of coronary angioplasty implant and graft: Secondary | ICD-10-CM

## 2019-10-05 DIAGNOSIS — F419 Anxiety disorder, unspecified: Secondary | ICD-10-CM | POA: Diagnosis not present

## 2019-10-05 DIAGNOSIS — I10 Essential (primary) hypertension: Secondary | ICD-10-CM | POA: Diagnosis not present

## 2019-10-05 DIAGNOSIS — Z79899 Other long term (current) drug therapy: Secondary | ICD-10-CM | POA: Diagnosis not present

## 2019-10-05 NOTE — Progress Notes (Signed)
Cardiology Office Consult Note    Date:  10/06/2019   ID:  Gabriel Jackson, DOB [birth year], MRN [id number]  PCP:  Daisy Floro, MD  Cardiologist:  Armanda Magic, MD   Chief Complaint  Patient presents with   Coronary Artery Disease   Hypertension   Palpitations   Hyperlipidemia    History of Present Illness:  Gabriel Jackson is a 59yo male with a hx of GERD, HTN and anxiety.  He was seen by Dr. Anne Fu in 2017 with complaints of chest pain and palpitations and CP was felt to be atypical.  ETT showed no ischemia.  I saw him back in March 2021 due to fatigue and SOB with minimal exertion.  He also was having palpitations.  Coronary CTA showed severe noncalcified plaque in the proximal LAD and went on to cardiac cath which showed severe single vessel disease with sequential 95%, 60% and 30% mid LAD stenosis and mild non-obstructive RCA, normal LV filling pressures.  He underwent PCI of the 95% and 60% LAD stenosis with a jailed D2 branch.  2D echo was normal.    He is here today for followup and is doing well.  He denies any chest pain or pressure, SOB, DOE, PND, orthopnea, LE edema, dizziness, palpitations or syncope. He is compliant with his meds and is tolerating meds with no SE.     Past Medical History:  Diagnosis Date   Anxiety    CAD (coronary artery disease)    Cath 06/06/19 (admx w [place]) >> mLAD 95, 60, 30; pRCA 30, mRCA 30 >> PCI:  DES to mLAD, D2 jailed by stent // Echo 05/2019: EF 60-65, mild asymmetric LVH, trivial MR, normal RVSf    GERD (gastroesophageal reflux disease)    HTN (hypertension)    Hyperlipidemia    OA (osteoarthritis)     Past Surgical History:  Procedure Laterality Date   CARDIAC CATHETERIZATION     COLONOSCOPY     CORONARY STENT INTERVENTION N/A 06/06/2019   Procedure: CORONARY STENT INTERVENTION;  Surgeon: Yvonne Kendall, MD;  Location: [facility];  Service: Cardiovascular;  Laterality: N/A;   LEFT HEART CATH AND CORONARY  ANGIOGRAPHY N/A 06/06/2019   Procedure: LEFT HEART CATH AND CORONARY ANGIOGRAPHY;  Surgeon: Yvonne Kendall, MD;  Location: [facility];  Service: Cardiovascular;  Laterality: N/A;   VASECTOMY      Current Medications: Current Meds  Medication Sig   amLODipine (NORVASC) 5 MG tablet Take 1 tablet (5 mg total) by mouth daily.   aspirin EC 81 MG tablet Take 1 tablet (81 mg total) by mouth daily.   atorvastatin (LIPITOR) 80 MG tablet Take 1 tablet (80 mg total) by mouth daily at 6 PM.   gabapentin (NEURONTIN) 300 MG capsule Take 300 mg by mouth at bedtime.   Glucosamine HCl (GLUCOSAMINE PO) Take 2 tablets by mouth daily.    metoprolol tartrate (LOPRESSOR) 25 MG tablet Take 1 tablet (25 mg total) by mouth 2 (two) times daily.   Multiple Vitamin (MULTIVITAMIN) capsule Take 1 capsule by mouth daily.   Multiple Vitamins-Minerals (EMERGEN-C IMMUNE PO) Take 1 tablet by mouth daily.   olmesartan (BENICAR) 40 MG tablet Take 1 tablet (40 mg total) by mouth daily.   omeprazole (PRILOSEC) 20 MG capsule Take 20 mg by mouth daily.   prasugrel (EFFIENT) 10 MG TABS tablet Take 1 tablet (10 mg total) by mouth daily.    Allergies:   Proair hfa [albuterol] and Zithromax [azithromycin]  Social History   Socioeconomic History   Marital status: Married    Spouse name: Not on file   Number of children: Not on file   Years of education: 14   Highest education level: Associate degree: occupational, Scientist, product/process development, or vocational program  Occupational History   Not on file  Tobacco Use   Smoking status: Never Smoker   Smokeless tobacco: Never Used  Vaping Use   Vaping Use: Never used  Substance and Sexual Activity   Alcohol use: Yes    Comment: beer on rare occasions   Drug use: No   Sexual activity: Not on file  Other Topics Concern   Not on file  Social History Narrative   Not on file   Social Determinants of Health   Financial Resource Strain:    Difficulty of  Paying Living Expenses:   Food Insecurity:    Worried About Programme researcher, broadcasting/film/video in the Last Year:    Barista in the Last Year:   Transportation Needs:    Freight forwarder (Medical):    Lack of Transportation (Non-Medical):   Physical Activity:    Days of Exercise per Week:    Minutes of Exercise per Session:   Stress:    Feeling of Stress :   Social Connections:    Frequency of Communication with Friends and Family:    Frequency of Social Gatherings with Friends and Family:    Attends Religious Services:    Active Member of Clubs or Organizations:    Attends Banker Meetings:    Marital Status:      Family History:  The patient's family history includes Diabetes in his father; Healthy in his brother.   ROS:   Please see the history of present illness.    ROS All other systems reviewed and are negative.  PAD Screen 01/07/2016  Previous PAD dx? No  Previous surgical procedure? No  Pain with walking? No  Feet/toe relief with dangling? No  Painful, non-healing ulcers? No  Extremities discolored? No    PHYSICAL EXAM:   VS:  BP 118/68    Pulse 54    Ht 5\' 11"  (1.803 m)    Wt (!) 222 lb (100.7 kg)    SpO2 97%    BMI 30.96 kg/m     GEN: Well nourished, well developed in no acute distress HEENT: Normal NECK: No JVD; No carotid bruits LYMPHATICS: No lymphadenopathy CARDIAC:RRR, no murmurs, rubs, gallops RESPIRATORY:  Clear to auscultation without rales, wheezing or rhonchi  ABDOMEN: Soft, non-tender, non-distended MUSCULOSKELETAL:  No edema; No deformity  SKIN: Warm and dry NEUROLOGIC:  Alert and oriented x 3 PSYCHIATRIC:  Normal affect    Wt Readings from Last 3 Encounters:  10/06/19 (!) 222 lb (100.7 kg)  10/06/19 (!) 220 lb 3.8 oz (99.9 kg)  09/01/19 219 lb 12.8 oz (99.7 kg)      Studies/Labs Reviewed:   EKG:  EKG is not ordered today  Recent Labs: 06/07/2019: BUN 14; Creatinine, Ser 1.06; Hemoglobin 14.8; Platelets 187;  Potassium 3.6; Sodium 142 08/16/2019: ALT 36   Lipid Panel    Component Value Date/Time   CHOL 83 (L) 08/16/2019 0737   TRIG 72 08/16/2019 0737   HDL 30 (L) 08/16/2019 0737   CHOLHDL 2.8 08/16/2019 0737   CHOLHDL 4.8 06/03/2019 1952   VLDL 13 06/03/2019 1952   LDLCALC 37 08/16/2019 0737    Additional studies/ records that were reviewed today include:   none  ASSESSMENT:    1. Coronary artery disease involving native coronary artery of native heart without angina pectoris   2. Palpitations   3. Essential hypertension   4. Gasping for breath   5. Hyperlipidemia with target LDL less than 70      PLAN:  In order of problems listed above:  1.  ASCAD -Coronary CTA showed severe noncalcified plaque in the proximal LAD  -cardiac cath which showed severe single vessel disease with sequential 95%, 60% and 30% mid LAD stenosis and mild non-obstructive RCA, normal LV filling pressures.   -S/P PCI of the 95% and 60% LAD stenosis with a jailed D2 branch.   -2D echo was normal.  -continue ASA 81mg  daily, Effient 10mg  daily, statin and BB  2.   Palpitations -noted to be PACs on heart monitor -continue Lopressor  3.  HTN -BP controlled -continue Olmesartan 40mg  daily, Lopressor 25mg  BID and amlodipine 5mg  daily -creatinine 1.06 in March  4.  Awakening gasping for breath -his wife has noticed he snoring and he occasionally will appear to be gasping for breath in his sleep -sleep study was ordered but not scheduled yet>> would like to defer on sleep study for now  5.  HLD -LDL goal < 70 -LDL was 37 in June -continue atorvastatin 80mg  daily   Medication Adjustments/Labs and Tests Ordered: Current medicines are reviewed at length with the patient today.  Concerns regarding medicines are outlined above.  Medication changes, Labs and Tests ordered today are listed in the Patient Instructions below.  There are no Patient Instructions on file for this visit.   Signed, , MD  10/06/2019 8:16 AM    [facility] [address] [place], [place], Armanda Magic  10/08/2019 Phone: [phone]; Fax: [phone]

## 2019-10-06 ENCOUNTER — Encounter: Payer: Self-pay | Admitting: Cardiology

## 2019-10-06 ENCOUNTER — Ambulatory Visit (INDEPENDENT_AMBULATORY_CARE_PROVIDER_SITE_OTHER): Payer: BC Managed Care – PPO | Admitting: Cardiology

## 2019-10-06 VITALS — BP 118/68 | HR 54 | Ht 71.0 in | Wt 222.0 lb

## 2019-10-06 DIAGNOSIS — I10 Essential (primary) hypertension: Secondary | ICD-10-CM | POA: Diagnosis not present

## 2019-10-06 DIAGNOSIS — I2510 Atherosclerotic heart disease of native coronary artery without angina pectoris: Secondary | ICD-10-CM

## 2019-10-06 DIAGNOSIS — R0689 Other abnormalities of breathing: Secondary | ICD-10-CM

## 2019-10-06 DIAGNOSIS — R002 Palpitations: Secondary | ICD-10-CM | POA: Diagnosis not present

## 2019-10-06 DIAGNOSIS — E785 Hyperlipidemia, unspecified: Secondary | ICD-10-CM

## 2019-10-06 NOTE — Patient Instructions (Signed)

## 2019-10-07 ENCOUNTER — Other Ambulatory Visit: Payer: Self-pay

## 2019-10-07 ENCOUNTER — Encounter (HOSPITAL_COMMUNITY)
Admission: RE | Admit: 2019-10-07 | Discharge: 2019-10-07 | Disposition: A | Discharge status: Home / Self Care | Payer: BC Managed Care – PPO | Source: Ambulatory Visit | Attending: Cardiology | Admitting: Cardiology

## 2019-10-07 DIAGNOSIS — F419 Anxiety disorder, unspecified: Secondary | ICD-10-CM | POA: Diagnosis not present

## 2019-10-07 DIAGNOSIS — E785 Hyperlipidemia, unspecified: Secondary | ICD-10-CM | POA: Diagnosis not present

## 2019-10-07 DIAGNOSIS — I10 Essential (primary) hypertension: Secondary | ICD-10-CM | POA: Diagnosis not present

## 2019-10-07 DIAGNOSIS — K219 Gastro-esophageal reflux disease without esophagitis: Secondary | ICD-10-CM | POA: Diagnosis not present

## 2019-10-07 DIAGNOSIS — M1990 Unspecified osteoarthritis, unspecified site: Secondary | ICD-10-CM | POA: Diagnosis not present

## 2019-10-07 DIAGNOSIS — Z7982 Long term (current) use of aspirin: Secondary | ICD-10-CM | POA: Diagnosis not present

## 2019-10-07 DIAGNOSIS — Z79899 Other long term (current) drug therapy: Secondary | ICD-10-CM | POA: Diagnosis not present

## 2019-10-07 DIAGNOSIS — I2510 Atherosclerotic heart disease of native coronary artery without angina pectoris: Secondary | ICD-10-CM | POA: Diagnosis not present

## 2019-10-07 DIAGNOSIS — Z955 Presence of coronary angioplasty implant and graft: Secondary | ICD-10-CM | POA: Diagnosis not present

## 2019-10-10 ENCOUNTER — Encounter (HOSPITAL_COMMUNITY): Payer: BC Managed Care – PPO

## 2019-10-11 DIAGNOSIS — J019 Acute sinusitis, unspecified: Secondary | ICD-10-CM | POA: Diagnosis not present

## 2019-10-12 ENCOUNTER — Encounter (HOSPITAL_COMMUNITY): Payer: BC Managed Care – PPO

## 2019-10-14 ENCOUNTER — Other Ambulatory Visit: Payer: Self-pay

## 2019-10-14 ENCOUNTER — Encounter (HOSPITAL_COMMUNITY)
Admission: RE | Admit: 2019-10-14 | Discharge: 2019-10-14 | Disposition: A | Discharge status: Home / Self Care | Payer: BC Managed Care – PPO | Source: Ambulatory Visit | Attending: Cardiology | Admitting: Cardiology

## 2019-10-14 DIAGNOSIS — Z955 Presence of coronary angioplasty implant and graft: Secondary | ICD-10-CM | POA: Diagnosis not present

## 2019-10-14 DIAGNOSIS — K219 Gastro-esophageal reflux disease without esophagitis: Secondary | ICD-10-CM | POA: Diagnosis not present

## 2019-10-14 DIAGNOSIS — M1990 Unspecified osteoarthritis, unspecified site: Secondary | ICD-10-CM | POA: Diagnosis not present

## 2019-10-14 DIAGNOSIS — Z79899 Other long term (current) drug therapy: Secondary | ICD-10-CM | POA: Insufficient documentation

## 2019-10-14 DIAGNOSIS — E785 Hyperlipidemia, unspecified: Secondary | ICD-10-CM | POA: Insufficient documentation

## 2019-10-14 DIAGNOSIS — Z7982 Long term (current) use of aspirin: Secondary | ICD-10-CM | POA: Diagnosis not present

## 2019-10-14 DIAGNOSIS — F419 Anxiety disorder, unspecified: Secondary | ICD-10-CM | POA: Diagnosis not present

## 2019-10-14 DIAGNOSIS — I2510 Atherosclerotic heart disease of native coronary artery without angina pectoris: Secondary | ICD-10-CM | POA: Diagnosis not present

## 2019-10-14 DIAGNOSIS — I10 Essential (primary) hypertension: Secondary | ICD-10-CM | POA: Diagnosis not present

## 2019-10-14 NOTE — Progress Notes (Signed)
Discharge Progress Report  Patient Details  Name: Gabriel Jackson MRN: [id number] Date of Birth: 15-Dec-1960 Referring Provider:     CARDIAC REHAB PHASE II ORIENTATION from 08/16/2019 in [place]  Referring Provider Fransico Him       Number of Visits: 20  Reason for Discharge:  Patient reached a stable level of exercise. Patient independent in their exercise. Patient has met program and personal goals.  Smoking History:  Social History   Tobacco Use  Smoking Status Never Smoker  Smokeless Tobacco Never Used    Diagnosis:  S/P DES LAD 06/06/19  ADL UCSD:   Initial Exercise Prescription:  Initial Exercise Prescription - 08/16/19 1000      Date of Initial Exercise RX and Referring Provider   Date 08/16/19    Referring Provider Fransico Him    Expected Discharge Date 10/14/19      Treadmill   MPH 3.2    Grade 0    Minutes 15    METs 3.45      NuStep   Level 3    SPM 70    Minutes 15    METs 2.5      Prescription Details   Frequency (times per week) 3    Duration Progress to 30 minutes of continuous aerobic without signs/symptoms of physical distress      Intensity   THRR 40-80% of Max Heartrate 65-130    Ratings of Perceived Exertion 11-13      Progression   Progression Continue progressive overload as per policy without signs/symptoms or physical distress.      Resistance Training   Training Prescription Yes    Weight 4    Reps 10-15           Discharge Exercise Prescription (Final Exercise Prescription Changes):  Exercise Prescription Changes - 10/14/19 0718      Response to Exercise   Blood Pressure (Admit) 138/82    Blood Pressure (Exercise) 146/94    Blood Pressure (Exit) 122/72    Heart Rate (Admit) 80 bpm    Heart Rate (Exercise) 138 bpm    Heart Rate (Exit) 89 bpm    Rating of Perceived Exertion (Exercise) 12    Perceived Dyspnea (Exercise) 0    Symptoms None    Comments None    Duration  Progress to 45 minutes of aerobic exercise without signs/symptoms of physical distress    Intensity THRR unchanged      Progression   Progression Continue to progress workloads to maintain intensity without signs/symptoms of physical distress.    Average METs 5.9      Resistance Training   Training Prescription Yes    Weight 8lbs    Reps 10-15    Time 10 Minutes      Interval Training   Interval Training No      Recumbant Bike   Level 3.8    Minutes 15    METs 7.1      NuStep   Level 7    SPM 110    Minutes 15    METs 4.6      Home Exercise Plan   Plans to continue exercise at [employer] [facility] [employer] (comment)   Local Gym & Walking   Frequency Add 2 additional days to program exercise sessions.    Initial Home Exercises Provided 10/14/19           Functional Capacity:  6 Minute Walk    Row Name 08/16/19 [phone] 10/06/19  8588       6 Minute Walk   Phase -- Discharge    Distance 1990 feet 2074 feet    Distance % Change -- 4.22 %    Distance Feet Change -- 84 ft    Walk Time 6 minutes 6 minutes    # of Rest Breaks 0 0    MPH 3.76 3.9    METS 4.96 5    RPE 12 12    Perceived Dyspnea  -- 0    VO2 Peak 17.38 17.5    Symptoms -- No    Resting HR 89 bpm 84 bpm    Resting BP 122/60 122/72    Resting Oxygen Saturation  98 % --    Exercise Oxygen Saturation  during 6 min walk 98 % --    Max Ex. HR 122 bpm 123 bpm    Max Ex. BP 142/80 130/80    2 Minute Post BP 122/76 100/62           Psychological, QOL, Others - Outcomes: PHQ 2/9: Depression screen [facility] 2/9 10/14/2019 08/16/2019  Decreased Interest 0 0  Down, Depressed, Hopeless 0 0  PHQ - 2 Score 0 0    Quality of Life:  Quality of Life - 10/20/19 0733      Quality of Life   Select PAD/SET Quality of Life           Personal Goals: Goals established at orientation with interventions provided to work toward goal.  Personal Goals and Risk Factors at Admission - 08/16/19 1111      Core Components/Risk  Factors/Patient Goals on Admission    Weight Management Yes;Obesity;Weight Loss    Intervention Weight Management: Develop a combined nutrition and exercise program designed to reach desired caloric intake, while maintaining appropriate intake of nutrient and fiber, sodium and fats, and appropriate energy expenditure required for the weight goal.;Weight Management: Provide education and appropriate resources to help participant work on and attain dietary goals.;Weight Management/Obesity: Establish reasonable short term and long term weight goals.;Obesity: Provide education and appropriate resources to help participant work on and attain dietary goals.    Admit Weight 219 lb 4.8 oz (99.5 kg)    Goal Weight: Short Term 5 lb (2.268 kg)    Goal Weight: Long Term 20 lb (9.072 kg)    Expected Outcomes Short Term: Continue to assess and modify interventions until short term weight is achieved;Long Term: Adherence to nutrition and physical activity/exercise program aimed toward attainment of established weight goal;Weight Loss: Understanding of general recommendations for a balanced deficit meal plan, which promotes 1-2 lb weight loss per week and includes a negative energy balance of [phone] kcal/d;Understanding recommendations for meals to include 15-35% energy as protein, 25-35% energy from fat, 35-60% energy from carbohydrates, less than 259m of dietary cholesterol, 20-35 gm of total fiber daily;Understanding of distribution of calorie intake throughout the day with the consumption of 4-5 meals/snacks    Hypertension Yes    Intervention Provide education on lifestyle modifcations including regular physical activity/exercise, weight management, moderate sodium restriction and increased consumption of fresh fruit, vegetables, and low fat dairy, alcohol moderation, and smoking cessation.;Monitor prescription use compliance.    Expected Outcomes Short Term: Continued assessment and intervention until BP is <  140/936mHG in hypertensive participants. < 130/8050mG in hypertensive participants with diabetes, heart failure or chronic kidney disease.;Long Term: Maintenance of blood pressure at goal levels.    Lipids Yes    Intervention Provide education and  support for participant on nutrition & aerobic/resistive exercise along with prescribed medications to achieve LDL <17m, HDL >422m    Expected Outcomes Short Term: Participant states understanding of desired cholesterol values and is compliant with medications prescribed. Participant is following exercise prescription and nutrition guidelines.;Long Term: Cholesterol controlled with medications as prescribed, with individualized exercise RX and with personalized nutrition plan. Value goals: LDL < 7053mHDL > 40 mg.    Stress Yes    Intervention Offer individual and/or small group education and counseling on adjustment to heart disease, stress management and health-related lifestyle change. Teach and support self-help strategies.    Expected Outcomes Short Term: Participant demonstrates changes in health-related behavior, relaxation and other stress management skills, ability to obtain effective social support, and compliance with psychotropic medications if prescribed.;Long Term: Emotional wellbeing is indicated by absence of clinically significant psychosocial distress or social isolation.            Personal Goals Discharge:  Goals and Risk Factor Review    Row Name 08/22/19 1224 08/30/19 1544 09/27/19 1620 10/14/19 1215       Core Components/Risk Factors/Patient Goals Review   Personal Goals Review Weight Management/Obesity;Hypertension;Lipids;Stress Weight Management/Obesity;Hypertension;Lipids;Stress Weight Management/Obesity;Hypertension;Lipids;Stress Weight Management/Obesity;Hypertension;Lipids;Stress    Review AlaBartlettarted exercise on 08/22/19 and tolerated exercise without difficulty. AlaSylus doing well with exercise. Brodie's vital signs and  weight have been stable. AlaBrynnntinues to do well with exercise. AlaKierres some intermittent exertional blood pressure elevations. Resting BP's WNL. AlaRoshaund well with exercise at cardiac rehab. AlaNewellmpleted cardiac rehab on 10/14/19.    Expected Outcomes Patient will continue to partcipate in phase 2 cardiac rehab for exercise nutrtion and lifestyle modifications AlaLongll continue to participate in phase 2 cardiac rehab for exercise, nutrtion and lifestyle modifications AlaBoscoll continue to participate in phase 2 cardiac rehab for exercise, nutrtion and lifestyle modifications AlaTaavians to continue exercise upon discharge from cardiac rehab by walking, doing projects around the house. AlaDennisonll continue to follow nutrtion and lifestyle modifications.           Exercise Goals and Review:  Exercise Goals    Row Name 08/16/19 1001             Exercise Goals   Increase Physical Activity Yes       Intervention Provide advice, education, support and counseling about physical activity/exercise needs.;Develop an individualized exercise prescription for aerobic and resistive training based on initial evaluation findings, risk stratification, comorbidities and participant's personal goals.       Expected Outcomes Short Term: Attend rehab on a regular basis to increase amount of physical activity.;Long Term: Add in home exercise to make exercise part of routine and to increase amount of physical activity.;Long Term: Exercising regularly at least 3-5 days a week.       Increase Strength and Stamina Yes       Intervention Provide advice, education, support and counseling about physical activity/exercise needs.;Develop an individualized exercise prescription for aerobic and resistive training based on initial evaluation findings, risk stratification, comorbidities and participant's personal goals.       Expected Outcomes Short Term: Increase workloads from initial exercise prescription for resistance,  speed, and METs.;Short Term: Perform resistance training exercises routinely during rehab and add in resistance training at home;Long Term: Improve cardiorespiratory fitness, muscular endurance and strength as measured by increased METs and functional capacity (6MWT)       Able to understand and use rate of perceived exertion (RPE) scale Yes  Intervention Provide education and explanation on how to use RPE scale       Expected Outcomes Short Term: Able to use RPE daily in rehab to express subjective intensity level;Long Term:  Able to use RPE to guide intensity level when exercising independently       Knowledge and understanding of Target Heart Rate Range (THRR) Yes       Intervention Provide education and explanation of THRR including how the numbers were predicted and where they are located for reference       Expected Outcomes Short Term: Able to state/look up THRR;Short Term: Able to use daily as guideline for intensity in rehab;Long Term: Able to use THRR to govern intensity when exercising independently       Able to check pulse independently Yes       Intervention Provide education and demonstration on how to check pulse in carotid and radial arteries.;Review the importance of being able to check your own pulse for safety during independent exercise       Expected Outcomes Short Term: Able to explain why pulse checking is important during independent exercise;Long Term: Able to check pulse independently and accurately       Understanding of Exercise Prescription Yes       Intervention Provide education, explanation, and written materials on patient's individual exercise prescription       Expected Outcomes Short Term: Able to explain program exercise prescription;Long Term: Able to explain home exercise prescription to exercise independently              Exercise Goals Re-Evaluation:  Exercise Goals Re-Evaluation    Row Name 08/22/19 1430 09/19/19 0718 10/20/19 0726          Exercise Goal Re-Evaluation   Exercise Goals Review Increase Strength and Stamina;Knowledge and understanding of Target Heart Rate Range (THRR);Understanding of Exercise Prescription Increase Strength and Stamina;Knowledge and understanding of Target Heart Rate Range (THRR);Understanding of Exercise Prescription;Increase Physical Activity;Able to understand and use rate of perceived exertion (RPE) scale;Able to check pulse independently Increase Strength and Stamina;Knowledge and understanding of Target Heart Rate Range (THRR);Understanding of Exercise Prescription;Increase Physical Activity;Able to understand and use rate of perceived exertion (RPE) scale;Able to check pulse independently     Comments Pt's first day of exercise. Oriented pt to equipment. Will continue to monitor. Reviewed HEP with pt. Discussed RPE Scale, THRR, NTG use (pt has not been carrying it with him), weather precuations, endpoints of exercise, warmup and cool down. We also discussed adding recumbent bike and elliptical to his exercise prescription. Pt responded well to changes, however pt will not exercise on ellipitcal due to previous knee pain. Pt completed 20 sessions of cardiac rehab. Pt increased his functional capacity by 4.22%, and increased his post 51mt distance by 835f Pt's main goals were to increase his strength and stamina, and to also return to his gym. Pt was able to progress to level 7 on Nustep from 3 and averaged around 6 METs.     Expected Outcomes Pt will continue to increase strength and stamina. Pt will plan to exercise 2-3 days at home for 30-45 minutes. Pt will continue to increase cardiovascular fitness. Pt will exercise 3-4 days a week, 30-45 minutes by walking and visiting local gym.            Nutrition & Weight - Outcomes:  Pre Biometrics - 08/16/19 1112      Pre Biometrics   Height 5' 11.25" (1.81 m)    Weight  99.7 kg    Waist Circumference 42 inches    Hip Circumference 42.5 inches    Waist  to Hip Ratio 0.99 %    BMI (Calculated) 30.43    Triceps Skinfold 12 mm    % Body Fat 27.9 %    Grip Strength 42.5 kg    Flexibility 13 in    Single Leg Stand 87.25 seconds           Post Biometrics - 10/06/19 0711       Post  Biometrics   Height 5' 11.25" (1.81 m)    Weight 99.9 kg    Waist Circumference 41 inches    Hip Circumference 41.5 inches    Waist to Hip Ratio 0.99 %    BMI (Calculated) 30.49    Triceps Skinfold 12 mm    % Body Fat 25.3 %    Grip Strength 44 kg    Flexibility 16.5 in    Single Leg Stand 87.5 seconds           Nutrition:  Nutrition Therapy & Goals - 09/01/19 0819      Nutrition Therapy   Diet Heart healthy    Drug/Food Interactions Statins/Certain Fruits      Personal Nutrition Goals   Nutrition Goal Pt to build a healthy plate including vegetables, fruits, whole grains, and low-fat dairy products in a heart healthy meal plan.      Intervention Plan   Intervention Prescribe, educate and counsel regarding individualized specific dietary modifications aiming towards targeted core components such as weight, hypertension, lipid management, diabetes, heart failure and other comorbidities.;Nutrition handout(s) given to patient.    Expected Outcomes Short Term Goal: Understand basic principles of dietary content, such as calories, fat, sodium, cholesterol and nutrients.           Nutrition Discharge:  Nutrition Assessments - 10/25/19 0707      MEDFICTS Scores   Pre Score 36    Post Score 15    Score Difference -21           Education Questionnaire Score:  Knowledge Questionnaire Score - 10/20/19 0733      Knowledge Questionnaire Score   Pre Score 19/24    Post Score 24/24           Goals reviewed with patient; copy given to patient. Pt graduated from cardiac rehab program on 10/14/19 with completion of 20 exercise sessions in Phase II. Pt maintained good attendance and progressed nicely during his participation in rehab as  evidenced by increased MET level.   Medication list reconciled. Repeat  PHQ score- 0 .  Pt has made significant lifestyle changes and should be commended for his success. Pt feels he has achieved his goals during cardiac rehab.   Pt plans to continue exercise by walking and doing projects around the house. Klyde increased his post exercise walk test distance by 84 feet. We are proud of Enrico's progress.Barnet Pall, RN,BSN 10/26/2019 1:41 PM

## 2019-10-18 ENCOUNTER — Telehealth: Payer: Self-pay | Admitting: Cardiology

## 2019-10-18 NOTE — Telephone Encounter (Signed)
Consulted DOD, Dr. Anne Fu, he recommends patient does not need premedications for a cleaning. Called Dentist office back with recommendations. They requested recommendations to be faxed to them. Faxed call.

## 2019-10-18 NOTE — Telephone Encounter (Signed)
     1. What dental office are you calling from? [facility]   2. What is your office phone number? [phone]  3. What is your fax number?[phone]  4. What type of procedure is the patient having performed? Cleaning  5. What date is procedure scheduled or is the patient there now? Pt is in the dental office now (if the patient is at the dentist's office question goes to their cardiologist if he/she is in the office.  If not, question should go to the DOD).   6. What is your question (ex. Antibiotics prior to procedure, holding medication-we need to know how long dentist wants pt to hold med)? They would like to know if pt needs to pre med.

## 2019-10-20 DIAGNOSIS — Z125 Encounter for screening for malignant neoplasm of prostate: Secondary | ICD-10-CM | POA: Diagnosis not present

## 2019-10-20 DIAGNOSIS — Z13220 Encounter for screening for lipoid disorders: Secondary | ICD-10-CM | POA: Diagnosis not present

## 2019-10-20 DIAGNOSIS — Z0000 Encounter for general adult medical examination without abnormal findings: Secondary | ICD-10-CM | POA: Diagnosis not present

## 2019-11-03 DIAGNOSIS — Z0000 Encounter for general adult medical examination without abnormal findings: Secondary | ICD-10-CM | POA: Diagnosis not present

## 2019-11-03 DIAGNOSIS — Z23 Encounter for immunization: Secondary | ICD-10-CM | POA: Diagnosis not present

## 2019-12-23 DIAGNOSIS — H40013 Open angle with borderline findings, low risk, bilateral: Secondary | ICD-10-CM | POA: Diagnosis not present

## 2020-02-10 DIAGNOSIS — Z20822 Contact with and (suspected) exposure to covid-19: Secondary | ICD-10-CM | POA: Diagnosis not present

## 2020-02-13 ENCOUNTER — Telehealth: Payer: Self-pay | Admitting: Cardiology

## 2020-02-13 NOTE — Telephone Encounter (Signed)
Patient is going to fax over a form that he is requesting for Dr. Mayford Knife to fill out for his work to exempt him from getting the COVID vaccine.

## 2020-02-13 NOTE — Telephone Encounter (Signed)
New Message:      Pt says he have a form from work that he needs Dr Mayford Knife to fill out. It is an exemption for him to have the COVID Vaccine. He says he still 3 blockage with 30% in each one, great possibilty of having blood clots.

## 2020-02-22 DIAGNOSIS — L821 Other seborrheic keratosis: Secondary | ICD-10-CM | POA: Diagnosis not present

## 2020-02-22 DIAGNOSIS — L578 Other skin changes due to chronic exposure to nonionizing radiation: Secondary | ICD-10-CM | POA: Diagnosis not present

## 2020-02-22 DIAGNOSIS — D225 Melanocytic nevi of trunk: Secondary | ICD-10-CM | POA: Diagnosis not present

## 2020-02-22 DIAGNOSIS — L720 Epidermal cyst: Secondary | ICD-10-CM | POA: Diagnosis not present

## 2020-03-13 ENCOUNTER — Telehealth: Payer: Self-pay | Admitting: Cardiology

## 2020-03-13 DIAGNOSIS — Z79899 Other long term (current) drug therapy: Secondary | ICD-10-CM | POA: Diagnosis not present

## 2020-03-13 DIAGNOSIS — N281 Cyst of kidney, acquired: Secondary | ICD-10-CM | POA: Diagnosis not present

## 2020-03-13 DIAGNOSIS — Z955 Presence of coronary angioplasty implant and graft: Secondary | ICD-10-CM | POA: Diagnosis not present

## 2020-03-13 DIAGNOSIS — I10 Essential (primary) hypertension: Secondary | ICD-10-CM | POA: Diagnosis not present

## 2020-03-13 DIAGNOSIS — K449 Diaphragmatic hernia without obstruction or gangrene: Secondary | ICD-10-CM | POA: Diagnosis not present

## 2020-03-13 DIAGNOSIS — R0789 Other chest pain: Secondary | ICD-10-CM | POA: Diagnosis not present

## 2020-03-13 DIAGNOSIS — Z7982 Long term (current) use of aspirin: Secondary | ICD-10-CM | POA: Diagnosis not present

## 2020-03-13 DIAGNOSIS — I700 Atherosclerosis of aorta: Secondary | ICD-10-CM | POA: Diagnosis not present

## 2020-03-13 DIAGNOSIS — Z20822 Contact with and (suspected) exposure to covid-19: Secondary | ICD-10-CM | POA: Diagnosis not present

## 2020-03-13 DIAGNOSIS — J984 Other disorders of lung: Secondary | ICD-10-CM | POA: Diagnosis not present

## 2020-03-13 DIAGNOSIS — K5730 Diverticulosis of large intestine without perforation or abscess without bleeding: Secondary | ICD-10-CM | POA: Diagnosis not present

## 2020-03-13 DIAGNOSIS — R110 Nausea: Secondary | ICD-10-CM | POA: Diagnosis not present

## 2020-03-13 DIAGNOSIS — R079 Chest pain, unspecified: Secondary | ICD-10-CM | POA: Diagnosis not present

## 2020-03-13 DIAGNOSIS — K219 Gastro-esophageal reflux disease without esophagitis: Secondary | ICD-10-CM | POA: Diagnosis not present

## 2020-03-13 DIAGNOSIS — R918 Other nonspecific abnormal finding of lung field: Secondary | ICD-10-CM | POA: Diagnosis not present

## 2020-03-13 NOTE — Telephone Encounter (Signed)
Spoke with the patient who states that he has been experiencing some heart fluttering sensations when he lays down on his side at night. He states that he does not notice it when he is up and moving around. He also reports that it occurs more often after eating a heavy meal. He also reports having less energy but has not been working out as often as he used to. He states that he does get slightly short of breath with exertion. He has not taken his HR during any of these episodes. He states that his blood pressure has been higher over the past couple months averaging in the 140s/80s. He denies any chest pain or squeezing sensation like he had in the past. States that he only felt a twinge on both sides of his chest sometimes that feels like a muscle strain.  Patient has an appointment with Dr. Mayford Knife on 01/06

## 2020-03-13 NOTE — Telephone Encounter (Signed)
Patient c/o Palpitations:  High priority if patient c/o lightheadedness, shortness of breath, or chest pain  1) How long have you had palpitations/irregular HR/ Afib? Are you having the symptoms now? Has been occurring past for about a month, not having symptoms now.  2) Are you currently experiencing lightheadedness, SOB or CP? No   3) Do you have a history of afib (atrial fibrillation) or irregular heart rhythm? No   4) Have you checked your BP or HR? (document readings if available): No is happening at night especially if going to bed with a full stomach   5) Are you experiencing any other symptoms? Toss and turn at night, heart feels its fluttering when he tried to sleep on his side. Muscle pull sensation in chest when it occurs.   Patient has an appt schedule in regards to this on 03/15/20.

## 2020-03-14 ENCOUNTER — Telehealth: Payer: Self-pay | Admitting: Cardiology

## 2020-03-14 NOTE — Telephone Encounter (Signed)
Patient is calling to make Dr. Mayford Knife and her nurse aware that he was in the ER at [facility] last night and was discharged this morning. He states that he has his discharge papers and doesn't know if we can acces his records from [facility]. Please advise.

## 2020-03-14 NOTE — Telephone Encounter (Signed)
Notes are in Care Everywhere.  Left message for patient advising him that Dr. Mayford Knife could see his records. Advised that if he had any further questions or concerns to call us back. He is already scheduled to see Dr. Mayford Knife tomorrow 03/15/20.

## 2020-03-15 ENCOUNTER — Encounter: Payer: Self-pay | Admitting: Cardiology

## 2020-03-15 ENCOUNTER — Other Ambulatory Visit: Payer: Self-pay

## 2020-03-15 ENCOUNTER — Ambulatory Visit (INDEPENDENT_AMBULATORY_CARE_PROVIDER_SITE_OTHER): Payer: BC Managed Care – PPO

## 2020-03-15 ENCOUNTER — Telehealth: Payer: Self-pay | Admitting: *Deleted

## 2020-03-15 ENCOUNTER — Ambulatory Visit: Payer: BC Managed Care – PPO | Admitting: Cardiology

## 2020-03-15 ENCOUNTER — Encounter: Payer: Self-pay | Admitting: *Deleted

## 2020-03-15 VITALS — BP 140/88 | HR 72 | Ht 71.0 in | Wt 226.2 lb

## 2020-03-15 DIAGNOSIS — R072 Precordial pain: Secondary | ICD-10-CM | POA: Diagnosis not present

## 2020-03-15 DIAGNOSIS — R0689 Other abnormalities of breathing: Secondary | ICD-10-CM | POA: Diagnosis not present

## 2020-03-15 DIAGNOSIS — R002 Palpitations: Secondary | ICD-10-CM

## 2020-03-15 DIAGNOSIS — I10 Essential (primary) hypertension: Secondary | ICD-10-CM

## 2020-03-15 DIAGNOSIS — I2510 Atherosclerotic heart disease of native coronary artery without angina pectoris: Secondary | ICD-10-CM | POA: Diagnosis not present

## 2020-03-15 DIAGNOSIS — E785 Hyperlipidemia, unspecified: Secondary | ICD-10-CM | POA: Diagnosis not present

## 2020-03-15 LAB — COMPREHENSIVE METABOLIC PANEL
ALT: 58 IU/L — ABNORMAL HIGH (ref 0–44)
AST: 38 IU/L (ref 0–40)
Albumin/Globulin Ratio: 3.6 — ABNORMAL HIGH (ref 1.2–2.2)
Albumin: 5.1 g/dL — ABNORMAL HIGH (ref 3.8–4.9)
Alkaline Phosphatase: 87 IU/L (ref 44–121)
BUN/Creatinine Ratio: 15 (ref 9–20)
BUN: 15 mg/dL (ref 6–24)
Bilirubin Total: 1.5 mg/dL — ABNORMAL HIGH (ref 0.0–1.2)
CO2: 24 mmol/L (ref 20–29)
Calcium: 10.1 mg/dL (ref 8.7–10.2)
Chloride: 105 mmol/L (ref 96–106)
Creatinine, Ser: 1.01 mg/dL (ref 0.76–1.27)
GFR calc Af Amer: 94 mL/min/{1.73_m2} (ref >59)
GFR calc non Af Amer: 81 mL/min/{1.73_m2} (ref >59)
Globulin, Total: 1.4 g/dL — ABNORMAL LOW (ref 1.5–4.5)
Glucose: 109 mg/dL — ABNORMAL HIGH (ref 65–99)
Potassium: 4.4 mmol/L (ref 3.5–5.2)
Sodium: 144 mmol/L (ref 134–144)
Total Protein: 6.5 g/dL (ref 6.0–8.5)

## 2020-03-15 LAB — TSH: TSH: 1.56 u[IU]/mL (ref 0.450–4.500)

## 2020-03-15 LAB — CBC
Hematocrit: 46.6 % (ref 37.5–51.0)
Hemoglobin: 16 g/dL (ref 13.0–17.7)
MCH: 29.1 pg (ref 26.6–33.0)
MCHC: 34.3 g/dL (ref 31.5–35.7)
MCV: 85 fL (ref 79–97)
Platelets: 182 10*3/uL (ref 150–450)
RBC: 5.5 x10E6/uL (ref 4.14–5.80)
RDW: 13 % (ref 11.6–15.4)
WBC: 8.9 10*3/uL (ref 3.4–10.8)

## 2020-03-15 MED ORDER — METOPROLOL TARTRATE 50 MG PO TABS: 50.0000 mg | ORAL_TABLET | Freq: Two times a day (BID) | ORAL | 3 refills | Status: DC

## 2020-03-15 NOTE — Patient Instructions (Signed)
Medication Instructions:  Your physician has recommended you make the following change in your medication:  1) INCREASE Lopressor to 50 mg twice daily   *If you need a refill on your cardiac medications before your next appointment, please call your pharmacy*   Lab Work: TODAY: TSH, CMET, CBC If you have labs (blood work) drawn today and your tests are completely normal, you will receive your results only by: Marland Kitchen MyChart Message (if you have MyChart) OR . A paper copy in the mail If you have any lab test that is abnormal or we need to change your treatment, we will call you to review the results.   Testing/Procedures: Your physician has requested that you have a lexiscan myoview. For further information please visit [email or web address] Please follow instruction sheet, as given.  Your physician has recommended that you wear an event monitor. Event monitors are medical devices that record the heart's electrical activity. Doctors most often [place] these monitors to diagnose arrhythmias. Arrhythmias are problems with the speed or rhythm of the heartbeat. The monitor is a small, portable device. You can wear one while you do your normal daily activities. This is usually used to diagnose what is causing palpitations/syncope (passing out). The event monitor will be mailed to you.   Your physician has recommended that you have a sleep study. This test records several body functions during sleep, including: brain activity, eye movement, oxygen and carbon dioxide blood levels, heart rate and rhythm, breathing rate and rhythm, the flow of air through your mouth and nose, snoring, body muscle movements, and chest and belly movement. You will be contacted by Dr. Norris Cross sleep coordinator to schedule.   Follow-Up: At [facility], you and your health needs are our priority.  As part of our continuing mission to provide you with exceptional heart care, we have created designated Provider Care Teams.  These Care  Teams include your primary Cardiologist (physician) and Advanced Practice Providers (APPs -  Physician Assistants and Nurse Practitioners) who all work together to provide you with the care you need, when you need it.  We recommend signing up for the patient portal called "MyChart".  Sign up information is provided on this After Visit Summary.  MyChart is used to connect with patients for Virtual Visits (Telemedicine).  Patients are able to view lab/test results, encounter notes, upcoming appointments, etc.  Non-urgent messages can be sent to your provider as well.   To learn more about what you can do with MyChart, go to [email or web address].    Your next appointment:   1 year(s)  The format for your next appointment:   In Person  Provider:   You may see Armanda Magic, MD or one of the following Advanced Practice Providers on your designated Care Team:    Ronie Spies, PA-C  Jacolyn Reedy, PA-C    Other Instructions Gabriel Jackson- Long Term Monitor Instructions   Your physician has requested you wear your ZIO patch monitor_14_days.   This is a single patch monitor.  [employer] supplies one patch monitor per enrollment.  Additional stickers are not available.   Please do not apply patch if you will be having a Nuclear Stress Test, Echocardiogram, Cardiac CT, MRI, or Chest Xray during the time frame you would be wearing the monitor. The patch cannot be worn during these tests.  You cannot remove and re-apply the ZIO XT patch monitor.   Your ZIO patch monitor will be sent USPS Priority mail from [facility] directly to your home  address. The monitor may also be mailed to a PO BOX if home delivery is not available.   It may take 3-5 days to receive your monitor after you have been enrolled.   Once you have received you monitor, please review enclosed instructions.  Your monitor has already been registered assigning a specific monitor serial # to you.   Applying the monitor   Shave hair  from upper left chest.   Hold abrader disc by orange tab.  Rub abrader in 40 strokes over left upper chest as indicated in your monitor instructions.   Clean area with 4 enclosed alcohol pads .  Use all pads to assure are is cleaned thoroughly.  Let dry.   Apply patch as indicated in monitor instructions.  Patch will be place under collarbone on left side of chest with arrow pointing upward.   Rub patch adhesive wings for 2 minutes.Remove white label marked "1".  Remove white label marked "2".  Rub patch adhesive wings for 2 additional minutes.   While looking in a mirror, press and release button in center of patch.  A small green light will flash 3-4 times .  This will be your only indicator the monitor has been turned on.     Do not shower for the first 24 hours.  You may shower after the first 24 hours.   Press button if you feel a symptom. You will hear a small click.  Record Date, Time and Symptom in the Patient Log Book.   When you are ready to remove patch, follow instructions on last 2 pages of Patient Log Book.  Stick patch monitor onto last page of Patient Log Book.   Place Patient Log Book in [place] box.  Use locking tab on box and tape box closed securely.  The [employer] and [employer] has [employer] on it.  Please place in mailbox as soon as possible.  Your physician should have your test results approximately 7 days after the monitor has been mailed back to [facility].   Call [facility] Customer Care at [phone] if you have questions regarding your ZIO XT patch monitor.  Call them immediately if you see an orange light blinking on your monitor.   If your monitor falls off in less than 4 days contact our Monitor department at [phone].  If your monitor becomes loose or falls off after 4 days call [employer] at [phone] for suggestions on securing your monitor.

## 2020-03-15 NOTE — Addendum Note (Signed)
Addended by: Theresia Majors on: 03/15/2020 10:38 AM   Modules accepted: Orders

## 2020-03-15 NOTE — Telephone Encounter (Signed)
-----   Message from Theresia Majors, RN sent at 03/15/2020 10:45 AM EST ----- PSG in lab sleep study has been ordered.  Thanks!

## 2020-03-15 NOTE — Addendum Note (Signed)
Addended by: Madalyn Rob A on: 03/15/2020 03:34 PM   Modules accepted: Orders

## 2020-03-15 NOTE — Progress Notes (Signed)
Cardiology Office Consult Note    Date:  03/15/2020   ID:  Gabriel Jackson, DOB [birth year], MRN [id number]  PCP:  Lawerance Cruel, MD  Cardiologist:  Fransico Him, MD   Chief Complaint  Patient presents with  . Coronary Artery Disease  . Hypertension  . Hyperlipidemia    History of Present Illness:  Gabriel Jackson is a 60yo male with a hx of GERD, HTN and anxiety.  He was seen by Dr. Marlou Porch in 2017 with complaints of chest pain and palpitations and CP was felt to be atypical.  ETT showed no ischemia.  I saw him back in March 2021 due to fatigue and SOB with minimal exertion.  He also was having palpitations.  Coronary CTA showed severe noncalcified plaque in the proximal LAD and went on to cardiac cath which showed severe single vessel disease with sequential 95%, 60% and 30% mid LAD stenosis and mild non-obstructive RCA, normal LV filling pressures.  He underwent PCI of the 95% and 60% LAD stenosis with a jailed D2 branch.  2D echo was normal.    He is here today for followup and is doing well but recently has had some problems with fluttering in his heart which started around Thanksgiving and would notice it when he was laying on his side and would lay on his back and it would settle down. He did not really notice any during the day.  He gets it every night and the past 2 days he has noticed it during the day.  He notices it more after he has eaten a meal.  He also has noticed increased fatigue after eating dinner.  It he eats his dinner late he feels the palpitations more.  He has not been working out like he had and has gained some weight and now has some mild DOE .  He has also noticed some problems with elevated BP.    He presented to the ER on 03/13/2020 with some vague chest discomfort like someone was sticking a needle under the skin with no radiation with no diaphoresis but some mild nausea.  BP in ER was 181/49mmHg and labs were normal except for LFTs mildly elevated.  He says that  he only drinks 1-2 beers weekly.  hsTrop was normal.  Chest CTA was normal.  .  He tells me that he is very stressed at work right now.   Past Medical History:  Diagnosis Date  . Anxiety   . CAD (coronary artery disease)    Cath 06/06/19 (admx w [place]) >> mLAD 95, 60, 30; pRCA 30, mRCA 30 >> PCI:  DES to mLAD, D2 jailed by stent // Echo 05/2019: EF 60-65, mild asymmetric LVH, trivial MR, normal RVSf   . GERD (gastroesophageal reflux disease)   . HTN (hypertension)   . Hyperlipidemia   . OA (osteoarthritis)     Past Surgical History:  Procedure Laterality Date  . CARDIAC CATHETERIZATION    . COLONOSCOPY    . CORONARY STENT INTERVENTION N/A 06/06/2019   Procedure: CORONARY STENT INTERVENTION;  Surgeon: Nelva Bush, MD;  Location: [place] [facility];  Service: Cardiovascular;  Laterality: N/A;  . LEFT HEART CATH AND CORONARY ANGIOGRAPHY N/A 06/06/2019   Procedure: LEFT HEART CATH AND CORONARY ANGIOGRAPHY;  Surgeon: Nelva Bush, MD;  Location: [place] [facility];  Service: Cardiovascular;  Laterality: N/A;  . VASECTOMY      Current Medications: Current Meds  Medication Sig  . amLODipine (NORVASC) 5  MG tablet Take 1 tablet (5 mg total) by mouth daily.  Marland Kitchen aspirin EC 81 MG tablet Take 1 tablet (81 mg total) by mouth daily.  Marland Kitchen atorvastatin (LIPITOR) 80 MG tablet Take 1 tablet (80 mg total) by mouth daily at 6 PM.  . Cholecalciferol (VITAMIN D3 PO) Take 1 capsule by mouth as directed.  . Cyanocobalamin (VITAMIN B-12 PO) Take 1 tablet by mouth as directed.  . gabapentin (NEURONTIN) 300 MG capsule Take 300 mg by mouth at bedtime.  . Glucosamine HCl (GLUCOSAMINE PO) Take 2 tablets by mouth daily.   . metoprolol tartrate (LOPRESSOR) 25 MG tablet Take 1 tablet (25 mg total) by mouth 2 (two) times daily.  . Multiple Vitamin (MULTIVITAMIN) capsule Take 1 capsule by mouth daily.  . Multiple Vitamins-Minerals (EMERGEN-C IMMUNE PO) Take 1 tablet by mouth daily.  . nitroGLYCERIN (NITROSTAT) 0.4  MG SL tablet Place 1 tablet (0.4 mg total) under the tongue every 5 (five) minutes x 3 doses as needed for chest pain.  Marland Kitchen olmesartan (BENICAR) 40 MG tablet Take 1 tablet (40 mg total) by mouth daily.  Marland Kitchen omeprazole (PRILOSEC) 20 MG capsule Take 20 mg by mouth daily.  . prasugrel (EFFIENT) 10 MG TABS tablet Take 1 tablet (10 mg total) by mouth daily.  Marland Kitchen zinc gluconate 50 MG tablet Take 50 mg by mouth daily.    Allergies:   Proair hfa [albuterol], Zithromax [azithromycin], and Other   Social History   Socioeconomic History  . Marital status: Married    Spouse name: Not on file  . Number of children: Not on file  . Years of education: 50  . Highest education level: Associate degree: occupational, Scientist, product/process development, or vocational program  Occupational History  . Not on file  Tobacco Use  . Smoking status: Never Smoker  . Smokeless tobacco: Never Used  Vaping Use  . Vaping Use: Never used  Substance and Sexual Activity  . Alcohol use: Yes    Comment: beer on rare occasions  . Drug use: No  . Sexual activity: Not on file  Other Topics Concern  . Not on file  Social History Narrative  . Not on file   Social Determinants of Health   Financial Resource Strain: Not on file  Food Insecurity: Not on file  Transportation Needs: Not on file  Physical Activity: Not on file  Stress: Not on file  Social Connections: Not on file     Family History:  The patient's family history includes Diabetes in his father; Healthy in his brother.   ROS:   Please see the history of present illness.    ROS All other systems reviewed and are negative.  PAD Screen 01/07/2016  Previous PAD dx? No  Previous surgical procedure? No  Pain with walking? No  Feet/toe relief with dangling? No  Painful, non-healing ulcers? No  Extremities discolored? No    PHYSICAL EXAM:   VS:  BP 140/88   Pulse 72   Ht 5\' 11"  (1.803 m)   Wt 226 lb 3.2 oz (102.6 kg)   BMI 31.55 kg/m     GEN: Well nourished, well  developed in no acute distress HEENT: Normal NECK: No JVD; No carotid bruits LYMPHATICS: No lymphadenopathy CARDIAC:RRR, no murmurs, rubs, gallops RESPIRATORY:  Clear to auscultation without rales, wheezing or rhonchi  ABDOMEN: Soft, non-tender, non-distended MUSCULOSKELETAL:  No edema; No deformity  SKIN: Warm and dry NEUROLOGIC:  Alert and oriented x 3 PSYCHIATRIC:  Normal affect    Wt  Readings from Last 3 Encounters:  03/15/20 226 lb 3.2 oz (102.6 kg)  10/06/19 (!) 222 lb (100.7 kg)  10/06/19 (!) 220 lb 3.8 oz (99.9 kg)      Studies/Labs Reviewed:   EKG:  EKG was not ordered today  Recent Labs: 06/07/2019: BUN 14; Creatinine, Ser 1.06; Hemoglobin 14.8; Platelets 187; Potassium 3.6; Sodium 142 08/16/2019: ALT 36   Lipid Panel    Component Value Date/Time   CHOL 83 (L) 08/16/2019 0737   TRIG 72 08/16/2019 0737   HDL 30 (L) 08/16/2019 0737   CHOLHDL 2.8 08/16/2019 0737   CHOLHDL 4.8 06/03/2019 1952   VLDL 13 06/03/2019 1952   LDLCALC 37 08/16/2019 0737    Additional studies/ records that were reviewed today include:   none  ASSESSMENT:    1. Coronary artery disease involving native coronary artery of native heart without angina pectoris   2. Essential hypertension   3. Gasping for breath   4. Hyperlipidemia with target LDL less than 70   5. Palpitations      PLAN:  In order of problems listed above:  1.  ASCAD -Coronary CTA showed severe noncalcified plaque in the proximal LAD  -cardiac cath which showed severe single vessel disease with sequential 95%, 60% and 30% mid LAD stenosis and mild non-obstructive RCA, normal LV filling pressures.   -S/P PCI of the 95% and 60% LAD stenosis with a jailed D2 branch.   -2D echo was normal.  -he denies any anginal symptoms until the other day when he had some atypical needle like discomfort pinpoint in his chest -he has had some DOE likely related to more sedentary state and weight gain recently but could be anginal  equivalent -I will get a Lexiscan myoview to rule out ischemia -continue ASA 81mg  daily, Effient 10mg  daily, statin and BB  2.  HTN -BP borderline controlled today on exam -continue Olmesartan 40mg  daily and amlodipine 5mg  daily -SCR 0.96 and K+ 4.4 in Aug 2021 -he admits to salting his food at home -increase lopressor to 50mg  BID  3.  Awakening gasping for breath -sleep study was ordered but patient apparently did the home study but no data on it -since he is having mainly nocturnal palpitations>>recommend in lab PSG  4.  HLD -LDL goal < 70 -LDL was 39 in Aug 2021 -continue atorvastatin 80mg  daily  5.  Palpitations -these mainly occur at night and ? Whether he has undx OSA -will get in lab PSG -2 week ziopatch to assess for arrhythmias -encouraged to cut out all caffeine and ETOH -increase Lopressor to 50mg  daily to help with palpitations -check TSH, CBC and CMET  Medication Adjustments/Labs and Tests Ordered: Current medicines are reviewed at length with the patient today.  Concerns regarding medicines are outlined above.  Medication changes, Labs and Tests ordered today are listed in the Patient Instructions below.  There are no Patient Instructions on file for this visit.   Signed, , MD  03/15/2020 10:28 AM    [facility] [address] [place], [place],  Armanda Magic Phone: [phone]; Fax: [phone]

## 2020-03-15 NOTE — Progress Notes (Signed)
Patient ID: Gabriel Jackson, male   DOB: [birth year], 60 y.o.   MRN: [id number] Patient enrolled for [employer] to ship a 14 day ZIO XT long term holter monitor to his home.

## 2020-03-18 DIAGNOSIS — Z20822 Contact with and (suspected) exposure to covid-19: Secondary | ICD-10-CM | POA: Diagnosis not present

## 2020-03-18 DIAGNOSIS — R5381 Other malaise: Secondary | ICD-10-CM | POA: Diagnosis not present

## 2020-03-18 DIAGNOSIS — R110 Nausea: Secondary | ICD-10-CM | POA: Diagnosis not present

## 2020-03-18 DIAGNOSIS — R5383 Other fatigue: Secondary | ICD-10-CM | POA: Diagnosis not present

## 2020-03-19 ENCOUNTER — Telehealth: Payer: Self-pay | Admitting: Cardiology

## 2020-03-19 MED ORDER — AMLODIPINE BESYLATE 10 MG PO TABS: 10.0000 mg | ORAL_TABLET | Freq: Every day | ORAL | 3 refills | Status: DC

## 2020-03-19 NOTE — Telephone Encounter (Signed)
Per Pt he has cut all sodium out of diet.

## 2020-03-19 NOTE — Telephone Encounter (Signed)
° °  Pt c/o BP issue: STAT if pt c/o blurred vision, one-sided weakness or slurred speech  1. What are your last 5 BP readings?  03/19/20: 141/85 HR 66 150/90 HR 66 165/94 HR 62 165/93 HR 64 158/92 HR 69  2. Are you having any other symptoms (ex. Dizziness, headache, blurred vision, passed out)? occasional dizziness  3. What is your BP issue? Pt feels like his new dosage of BP medicine is not working   Pt c/o medication issue:  1. Name of Medication: metoprolol tartrate (LOPRESSOR) 50 MG tablet  2. How are you currently taking this medication (dosage and times per day)? 50 mg twice daily   3. Are you having a reaction (difficulty breathing--STAT)?   4. What is your medication issue? Patient said his BP is still not under control.

## 2020-03-19 NOTE — Telephone Encounter (Signed)
Increase amlodipine to 10mg  daily and check BP and HR daily for a week and call with results.

## 2020-03-19 NOTE — Telephone Encounter (Signed)
Returned call to Pt.  Pt calling with concerns regarding continued high blood pressures.  Pt states he woke up this morning because he felt his blood pressure was high.  He checked it and it was 165/94 pulse 62 prior to morning medications.  He took his morning medications (amlodipine, metoprolol and olemsartan) and his current blood pressure is 141/85 pulse 66.  He states normally his blood pressure will be ok until about 2:00 pm.  Then at 2:00 pm he will have increased palpitations, nausea, just "generally not feeling good".  When he rechecks at this time his blood pressure will be 174/106 pulse 82.    He states when this happens (at 2:00 pm) he will chew an aspirin to bring down his blood pressure.  Pt states he has "just not felt well" since his last visit with Dr. Mayford Knife.  He thinks it is related to his heart.  Pt has a stress test scheduled for this week.  Advised would forward to Dr. Mayford Knife to see if she wants to make any additional med changes.  Recommended Pt try taking his amlodipine at lunch time to see if that helps with his blood pressures in the early afternoon.

## 2020-03-19 NOTE — Telephone Encounter (Signed)
Please find out if he has cut out all Na in his diet

## 2020-03-19 NOTE — Telephone Encounter (Signed)
Left detailed message for patient with Dr. Norris Cross advice. I advised he may take amlodipine 10 mg as one dose or take 5 mg in the morning and 5 mg at lunch or early afternoon. I advised him to call back with questions or concerns prior to one week or at the end of one week after recording HR and BP readings.

## 2020-03-20 ENCOUNTER — Other Ambulatory Visit: Payer: Self-pay

## 2020-03-20 ENCOUNTER — Telehealth: Payer: Self-pay

## 2020-03-20 DIAGNOSIS — I2510 Atherosclerotic heart disease of native coronary artery without angina pectoris: Secondary | ICD-10-CM

## 2020-03-20 DIAGNOSIS — I2583 Coronary atherosclerosis due to lipid rich plaque: Secondary | ICD-10-CM

## 2020-03-20 NOTE — Telephone Encounter (Signed)
Spoke with the patient, detailed instructions given. He stated that he understood and would be there for his test. Asked to call back with any questions. S.Alpha Chouinard EMTP 

## 2020-03-20 NOTE — Progress Notes (Signed)
Shared Decision Making/Informed Consent The risks [chest pain, shortness of breath, cardiac arrhythmias, dizziness, blood pressure fluctuations, myocardial infarction, stroke/transient ischemic attack, nausea, vomiting, allergic reaction, radiation exposure, metallic taste sensation and life-threatening complications (estimated to be 1 in 10,000)], benefits (risk stratification, diagnosing coronary artery disease, treatment guidance) and alternatives of a nuclear stress test were discussed in detail with Mr. Gabriel Jackson and he agrees to proceed.

## 2020-03-20 NOTE — Addendum Note (Signed)
Addended by: Madalyn Rob A on: 03/20/2020 04:57 PM   Modules accepted: Orders

## 2020-03-22 ENCOUNTER — Other Ambulatory Visit: Payer: Self-pay

## 2020-03-22 ENCOUNTER — Ambulatory Visit (HOSPITAL_COMMUNITY): Payer: BC Managed Care – PPO | Attending: Cardiovascular Disease

## 2020-03-22 DIAGNOSIS — R072 Precordial pain: Secondary | ICD-10-CM | POA: Diagnosis not present

## 2020-03-22 DIAGNOSIS — R002 Palpitations: Secondary | ICD-10-CM

## 2020-03-22 LAB — MYOCARDIAL PERFUSION IMAGING
LV dias vol: 114 mL (ref 62–150)
LV sys vol: 53 mL
Peak HR: 108 {beats}/min
Rest HR: 65 {beats}/min
SDS: 1
SRS: 0
SSS: 1
TID: 1.08

## 2020-03-22 MED ORDER — TECHNETIUM TC 99M TETROFOSMIN IV KIT
10.7000 | PACK | Freq: Once | INTRAVENOUS | Status: AC | PRN
  Administered 2020-03-22: 10.7 via INTRAVENOUS
  Filled 2020-03-22: qty 11

## 2020-03-22 MED ORDER — REGADENOSON 0.4 MG/5ML IV SOLN
0.4000 mg | Freq: Once | INTRAVENOUS | Status: AC
  Administered 2020-03-22: 0.4 mg via INTRAVENOUS

## 2020-03-22 MED ORDER — TECHNETIUM TC 99M TETROFOSMIN IV KIT
31.1000 | PACK | Freq: Once | INTRAVENOUS | Status: AC | PRN
  Administered 2020-03-22: 31.1 via INTRAVENOUS
  Filled 2020-03-22: qty 32

## 2020-04-04 ENCOUNTER — Telehealth: Payer: Self-pay | Admitting: Cardiology

## 2020-04-04 NOTE — Telephone Encounter (Signed)
Pt c/o BP issue: STAT if pt c/o blurred vision, one-sided weakness or slurred speech  1. What are your last 5 BP readings?  Has them but not with him at the moment  2. Are you having any other symptoms (ex. Dizziness, headache, blurred vision, passed out)? Can't sleep on side because it makes it worse has to sleep on back   3. What is your BP issue? States the issues he was having before medications changes were made for this has came back. He has noticed his BP lowers when he exerts himself and he start's feeling unwell once it hits the 150's. He is also wanting to check on the status of his sleep study being scheduled. Please advise.

## 2020-04-04 NOTE — Telephone Encounter (Signed)
Spoke with the patient who states that his BP came down for a bit after his amlodipine was increased. Readings were around 107/67 and 118/74. He states that his blood pressure has started to come back up. Usually around 140/80 but gets up to 150-160s/90s. He denies any increased sodium in his diet. He states that it often occurs at night when he is lying down. He notices it more after he has eaten a large meal at night. He states that when his blood pressure get that high he doesn't feel good. Denies any chest pain, SOB, or dizziness. He states that he does feel pounding in his chest when his BP is elevated but does not feel like his heart is out of rhythm. He is on his last day of wearing his event monitor. He reports that he has been worried to exert himself because he does not want his BP increasing too high. He is taking metoprolol 50 mg BID, amlodipine 10mg  qd, and olmesartan 40 mg qd.   Patient also states that he continues to wake up gasping for air. He has not been contacted to schedule his sleep study yet. Advised that I would send a message to our sleep coordinator to have it scheduled.

## 2020-04-04 NOTE — Addendum Note (Signed)
Addended by: Madalyn Rob A on: 04/04/2020 03:00 PM   Modules accepted: Orders

## 2020-04-05 NOTE — Telephone Encounter (Signed)
Lets see what heart monitor shows and continue to follow BP and if persistently > 150/2mmHg then needs to call

## 2020-04-06 ENCOUNTER — Telehealth: Payer: Self-pay | Admitting: *Deleted

## 2020-04-06 NOTE — Telephone Encounter (Signed)
Staff message sent to Gabriel Jackson to contact patient to see if he has any other insurance. Per [employer] in chart states patient is not active. Resend with any new insurance information.

## 2020-04-06 NOTE — Telephone Encounter (Signed)
-----   Message from Pollyann Kennedy sent at 04/04/2020  3:46 PM EST ----- The insurance for this patient is not active. ----- Message ----- From: Theresia Majors, RN Sent: 03/15/2020  10:45 AM EST To: Reesa Chew, CMA, Cv Div Sleep Studies  PSG in lab sleep study has been ordered.  Thanks!

## 2020-04-10 DIAGNOSIS — R002 Palpitations: Secondary | ICD-10-CM | POA: Diagnosis not present

## 2020-04-11 NOTE — Telephone Encounter (Signed)
Reached out to patient and left a message to call back with new insurance information.

## 2020-04-12 ENCOUNTER — Encounter: Payer: Self-pay | Admitting: Cardiology

## 2020-04-12 DIAGNOSIS — I471 Supraventricular tachycardia: Secondary | ICD-10-CM | POA: Insufficient documentation

## 2020-04-12 DIAGNOSIS — I4719 Other supraventricular tachycardia: Secondary | ICD-10-CM | POA: Insufficient documentation

## 2020-04-12 NOTE — Telephone Encounter (Signed)
Quintella Reichert, MD  04/12/2020 1:26 PM EST Back to Top     Heart monitor showed occasional extra heart beats in runs from top of heart. NO atrial fibrillation. Please find out if palpitations have settled down with increase in dose of lopressor   The patient has been notified of the result and verbalized understanding.  All questions (if any) were answered. Theresia Majors, RN 04/12/2020 2:33 PM  Patient states that palpitations are not quite as frequent, however they are very noticeable when he lays on his side. He states that he can feel his heart pounding.  Patient reported some additional blood pressure readings from the past few days: 174/94, 146/90, 142/86, 157/89, 144/77, 139/85, 138/84, 141/82, 127/36, 131/78, and 118/73 He also states that his wife and daughter just tested positive for COVID and that he is not feeling great himself and is going to get tested so he will most likely have to wait a bit to have his sleep study done.

## 2020-04-15 NOTE — Telephone Encounter (Signed)
Please find out if he tested positive for COVID

## 2020-04-17 NOTE — Telephone Encounter (Signed)
Patient called back with valid insurance information. Front Information systems manager has verified insurance and study has been resent to sleep pool.

## 2020-04-17 NOTE — Telephone Encounter (Signed)
Agree for him to check BP daily at lunch for a week and call with results

## 2020-04-17 NOTE — Telephone Encounter (Signed)
Spoke with the patient who states that he did not test positive for COVID. He has been quarantining from his family members and has not had any symptoms. He did state he had some lower blood pressure readings the other day: 102/67, 100/62. He states that he does get lightheaded at times and feels off - he doesn't always take his blood pressure during these episodes but feels that it has dropped on the lower side. He states that this morning before he took his meds BP was 151/89. He states that it has been all over the place. He continues to work on his diet, avoid salt and caffeine. He states that he has lost about 10 lbs the past couple months. Advised the patient that he should monitor his BP at lunch time daily and call us with those readings.  He also states that he needs to call Coralee North back about insurance information so that his sleep study can be scheduled. Advised him to go ahead and give her a call so we can get that appointment set as soon as possible.

## 2020-04-18 ENCOUNTER — Telehealth: Payer: Self-pay | Admitting: *Deleted

## 2020-04-18 NOTE — Telephone Encounter (Signed)
Gabriel Jackson, CMA  Reesa Chew, CMA Ok to schedule PSG. Per [employer] no PA is required.

## 2020-04-18 NOTE — Telephone Encounter (Signed)
Staff message sent to Coralee North ok to schedule sleep study. Per [employer] no PA is required. Call reference # [id number].

## 2020-04-26 NOTE — Telephone Encounter (Signed)
Patient is scheduled for lab study on 06/12/20. Patient understands he sleep study will be done at [facility] sleep lab. Patient understands he will receive a sleep packet in a week or so. Patient understands to call if she does not receive the sleep packet in a timely manner. Patient agrees with treatment and thanked me for call.

## 2020-05-23 ENCOUNTER — Other Ambulatory Visit: Payer: Self-pay

## 2020-05-23 MED ORDER — PRASUGREL HCL 10 MG PO TABS: 10.0000 mg | ORAL_TABLET | Freq: Every day | ORAL | 3 refills | Status: DC

## 2020-05-30 ENCOUNTER — Telehealth: Payer: Self-pay | Admitting: Cardiology

## 2020-05-30 MED ORDER — OLMESARTAN MEDOXOMIL 40 MG PO TABS: 40.0000 mg | ORAL_TABLET | Freq: Every day | ORAL | 3 refills | Status: DC

## 2020-05-30 MED ORDER — ATORVASTATIN CALCIUM 80 MG PO TABS: 80.0000 mg | ORAL_TABLET | Freq: Every day | ORAL | 3 refills | Status: DC

## 2020-05-30 NOTE — Telephone Encounter (Signed)
Spoke with the patient and advised him on small risk of interactions from pharmacist. Patient will monitor for bleeding and monitor is HR. He will let us know if he has any issues.

## 2020-05-30 NOTE — Telephone Encounter (Signed)
There are only a few minor interactions. Fluoxetine with ASA and prasugrel can increase risk of bleeding. However this risk is small and patient should just monitor for s/sx of bleeding. Fluoxetine can also increase concentration of metoprolol. Usually not an issue, but would monitor HR. If falls below 55 often- would let us know.

## 2020-05-30 NOTE — Telephone Encounter (Signed)
*  STAT* If patient is at the pharmacy, call can be transferred to refill team.   1. Which medications need to be refilled? (please list name of each medication and dose if known)  olmesartan (BENICAR) 40 MG tablet atorvastatin (LIPITOR) 80 MG tablet  2. Which pharmacy/location (including street and city if local pharmacy) is medication to be sent to?  [facility] - [place], [place] - [address]   3. Do they need a 30 day or 90 day supply? 90 with refills   Patient only has two days left of medication

## 2020-05-30 NOTE — Telephone Encounter (Signed)
Pt c/o medication issue:  1. Name of Medication: FLUoxetine (PROZAC) 20 MG   2. How are you currently taking this medication (dosage and times per day)? Patient is not taking yet   3. Are you having a reaction (difficulty breathing--STAT)?   4. What is your medication issue? Patient wanted to start taking this medication again. He has reached out to his PCP today and left them a message ut has not heard anything from them yet. He wanted to  know if any of the medications Dr. Mayford Knife had him on would interact with this.  If there are interactions he would like to know what medications he would be able to take.

## 2020-05-30 NOTE — Telephone Encounter (Signed)
Called pt back to let him know that the refill for Atorvastatin and Benicar have been sent to [facility]  Pt was appreciative of the call.

## 2020-06-04 ENCOUNTER — Telehealth: Payer: Self-pay | Admitting: Cardiology

## 2020-06-04 DIAGNOSIS — F410 Panic disorder [episodic paroxysmal anxiety] without agoraphobia: Secondary | ICD-10-CM | POA: Diagnosis not present

## 2020-06-04 DIAGNOSIS — F411 Generalized anxiety disorder: Secondary | ICD-10-CM | POA: Diagnosis not present

## 2020-06-04 NOTE — Telephone Encounter (Signed)
Pt c/o BP issue: STAT if pt c/o blurred vision, one-sided weakness or slurred speech  1. What are your last 5 BP readings? No   2. Are you having any other symptoms (ex. Dizziness, headache, blurred vision, passed out)?  Not sure if its due to panic attack dizziness can not eat and sleep at night.  3. What is your BP issue? Not sure.

## 2020-06-04 NOTE — Telephone Encounter (Signed)
Lets see what BP looks like on AMlodipine

## 2020-06-04 NOTE — Telephone Encounter (Signed)
Spoke with the patient and his wife who report that the patient's blood pressure has been dropping and heart rate has been increasing. The patient states that he has been under a tremendous amount of stress at work and has been dealing with panic attacks, lack of sleep, and lack of appetite recently. He saw his PCP today who prescribed him medication for panic attacks.  He states that he has been getting episodes of light headedness and is concerned it is due to BP dropping too low. He states that he has cut back his amlodipine and is only taking 5 mg per day and seems to feel better on this . Continues to take Lopressor 50 mg twice per day. He states that his palpitations seem to be under control other than when he lays on side at night. Blood pressures range from 110s/60s to 130s/70s.  Patient's wife thinks that his automatic cuff is not accurate. They are going today to get a manual cuff because she is able to use that to take his BP. Advised to keep a log of readings daily around lunch time and call us.  They would like to know if Dr. Mayford Knife is okay with him only taking amlodipine 5 mg per day.

## 2020-06-11 ENCOUNTER — Telehealth: Payer: Self-pay | Admitting: *Deleted

## 2020-06-11 ENCOUNTER — Other Ambulatory Visit (HOSPITAL_BASED_OUTPATIENT_CLINIC_OR_DEPARTMENT_OTHER): Payer: Self-pay | Admitting: Cardiology

## 2020-06-11 NOTE — Telephone Encounter (Signed)
Spoke with Clydie Braun at [facility], Rx request generated for Amlodipine 5 mg, patient's medication has been increased to Amlodipine 10 mg with refills. Patient doesn't need refills at this time.

## 2020-06-12 ENCOUNTER — Other Ambulatory Visit: Payer: Self-pay

## 2020-06-12 ENCOUNTER — Ambulatory Visit (HOSPITAL_BASED_OUTPATIENT_CLINIC_OR_DEPARTMENT_OTHER): Payer: BC Managed Care – PPO | Attending: Cardiology | Admitting: Cardiology

## 2020-06-12 DIAGNOSIS — G4733 Obstructive sleep apnea (adult) (pediatric): Secondary | ICD-10-CM | POA: Diagnosis not present

## 2020-06-12 DIAGNOSIS — R0689 Other abnormalities of breathing: Secondary | ICD-10-CM

## 2020-06-14 NOTE — Procedures (Signed)
   Patient Name: Gabriel Jackson, Gabriel Jackson Date: 06/12/2020 Gender: Male D.O.B: 1961/02/18 Age (years): 69 Referring Provider: Armanda Magic MD, ABSM Height (inches): 71 Interpreting Physician: Armanda Magic MD, ABSM Weight (lbs): 210 RPSGT: Shelah Lewandowsky BMI: 29 MRN: [id number] Neck Size: 16.00  CLINICAL INFORMATION Sleep Study Type: NPSG  Indication for sleep study: Fatigue, Hypertension, Insomnia, Morning Headaches, Snoring, Witnesses Apnea / Gasping During Sleep  Epworth Sleepiness Score: 12  SLEEP STUDY TECHNIQUE As per the AASM Manual for the Scoring of Sleep and Associated Events v2.3 (April 2016) with a hypopnea requiring 4% desaturations.  The channels recorded and monitored were frontal, central and occipital EEG, electrooculogram (EOG), submentalis EMG (chin), nasal and oral airflow, thoracic and abdominal wall motion, anterior tibialis EMG, snore microphone, electrocardiogram, and pulse oximetry.  MEDICATIONS Medications self-administered by patient taken the night of the study : N/A  SLEEP ARCHITECTURE The study was initiated at 10:01:41 PM and ended at 5:28:40 AM.  Sleep onset time was 13.9 minutes and the sleep efficiency was 89.4%. The total sleep time was 399.5 minutes.  Stage REM latency was 217.0 minutes.  The patient spent 6.6% of the night in stage N1 sleep, 73.3% in stage N2 sleep, 0.0% in stage N3 and 20% in REM.  Alpha intrusion was absent.  Supine sleep was 65.46%.  RESPIRATORY PARAMETERS The overall apnea/hypopnea index (AHI) was 5.4 per hour. There were 4 total apneas, including 0 obstructive, 4 central and 0 mixed apneas. There were 32 hypopneas and 23 RERAs.  The AHI during Stage REM sleep was 12.0 per hour.  AHI while supine was 7.6 per hour.  The mean oxygen saturation was 94.1%. The minimum SpO2 during sleep was 86.0%.  moderate snoring was noted during this study.  CARDIAC DATA The 2 lead EKG demonstrated sinus rhythm. The mean heart  rate was 54.2 beats per minute. Other EKG findings include: PVCs.  LEG MOVEMENT DATA The total PLMS were 0 with a resulting PLMS index of 0.0. Associated arousal with leg movement index was 0.2 .  IMPRESSIONS - Mild obstructive sleep apnea occurred during this study (AHI = 5.4/h). - No significant central sleep apnea occurred during this study (CAI = 0.6/h). - Mild oxygen desaturation was noted during this study (Min O2 = 86.0%). - The patient snored with moderate snoring volume. - EKG findings include PVCs. - Clinically significant periodic limb movements did not occur during sleep. No significant associated arousals.  DIAGNOSIS - Obstructive Sleep Apnea (G47.33)  RECOMMENDATIONS - Positional therapy avoiding supine position during sleep. - Very mild obstructive sleep apnea. Return to discuss treatment options. - Avoid alcohol, sedatives and other CNS depressants that may worsen sleep apnea and disrupt normal sleep architecture. - Sleep hygiene should be reviewed to assess factors that may improve sleep quality. - Weight management and regular exercise should be initiated or continued if appropriate.  [Electronically signed] 06/14/2020 07:35 PM  Armanda Magic MD, ABSM Diplomate, American Board of Sleep Medicine

## 2020-06-20 ENCOUNTER — Telehealth: Payer: Self-pay | Admitting: *Deleted

## 2020-06-20 NOTE — Telephone Encounter (Signed)
Informed patient of sleep study results and patient understanding was verbalized. Patient understands his sleep study showed Minimal sleep apnea.  Patient states he is not still waking up gasping for breath at night. Pt is agreeable to normal results.

## 2020-06-20 NOTE — Telephone Encounter (Signed)
-----   Message from Quintella Reichert, MD sent at 06/14/2020  7:39 PM EDT ----- Minimal sleep apnea - please find out if he is still waking up gasping for breath at night

## 2020-06-25 DIAGNOSIS — F411 Generalized anxiety disorder: Secondary | ICD-10-CM | POA: Diagnosis not present

## 2020-06-25 DIAGNOSIS — G4733 Obstructive sleep apnea (adult) (pediatric): Secondary | ICD-10-CM | POA: Diagnosis not present

## 2020-08-07 DIAGNOSIS — M25462 Effusion, left knee: Secondary | ICD-10-CM | POA: Diagnosis not present

## 2020-08-07 DIAGNOSIS — M1712 Unilateral primary osteoarthritis, left knee: Secondary | ICD-10-CM | POA: Diagnosis not present

## 2020-09-03 ENCOUNTER — Ambulatory Visit (HOSPITAL_COMMUNITY)
Admission: RE | Admit: 2020-09-03 | Discharge: 2020-09-03 | Disposition: A | Discharge status: Home / Self Care | Payer: BLUE CROSS/BLUE SHIELD | Source: Ambulatory Visit | Attending: Family Medicine | Admitting: Family Medicine

## 2020-09-03 ENCOUNTER — Other Ambulatory Visit: Payer: Self-pay

## 2020-09-03 ENCOUNTER — Other Ambulatory Visit (HOSPITAL_COMMUNITY): Payer: Self-pay | Admitting: Family Medicine

## 2020-09-03 DIAGNOSIS — M79662 Pain in left lower leg: Secondary | ICD-10-CM | POA: Insufficient documentation

## 2020-09-03 DIAGNOSIS — M7989 Other specified soft tissue disorders: Secondary | ICD-10-CM

## 2020-09-03 DIAGNOSIS — M79605 Pain in left leg: Secondary | ICD-10-CM | POA: Diagnosis not present

## 2020-09-03 NOTE — Progress Notes (Signed)
LLE venous duplex has been completed.  Preliminary findings given to [place], RN at physician's office.  Results can be found under chart review under CV PROC. 09/03/2020 10:23 AM Khairi Garman RVT, RDMS

## 2020-11-19 IMAGING — CR DG CHEST 2V
2 series · 2 of 2 positions shown · non-contrast
Comparison: October 11, 2009

CLINICAL DATA: Chest tightness and shortness of breath.

EXAM:
CHEST - 2 VIEW

[chest pa]
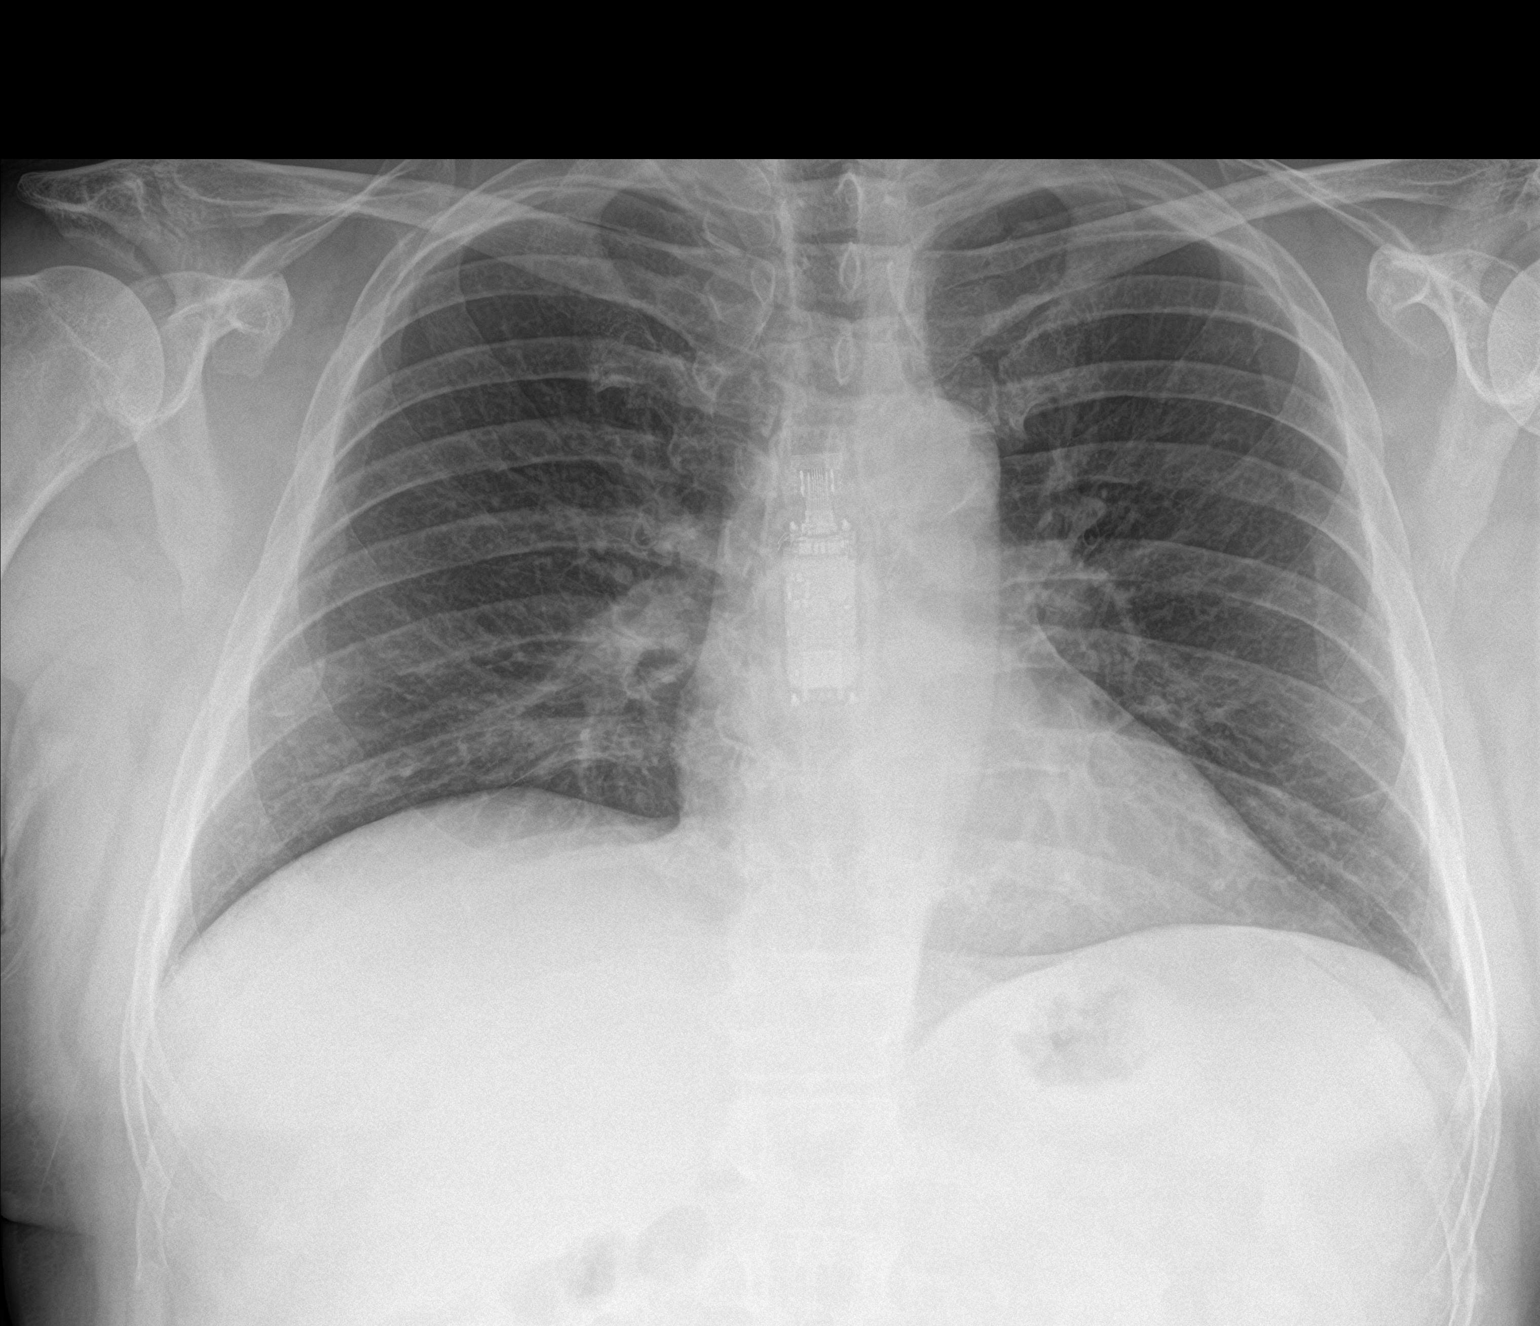

[chest lat]
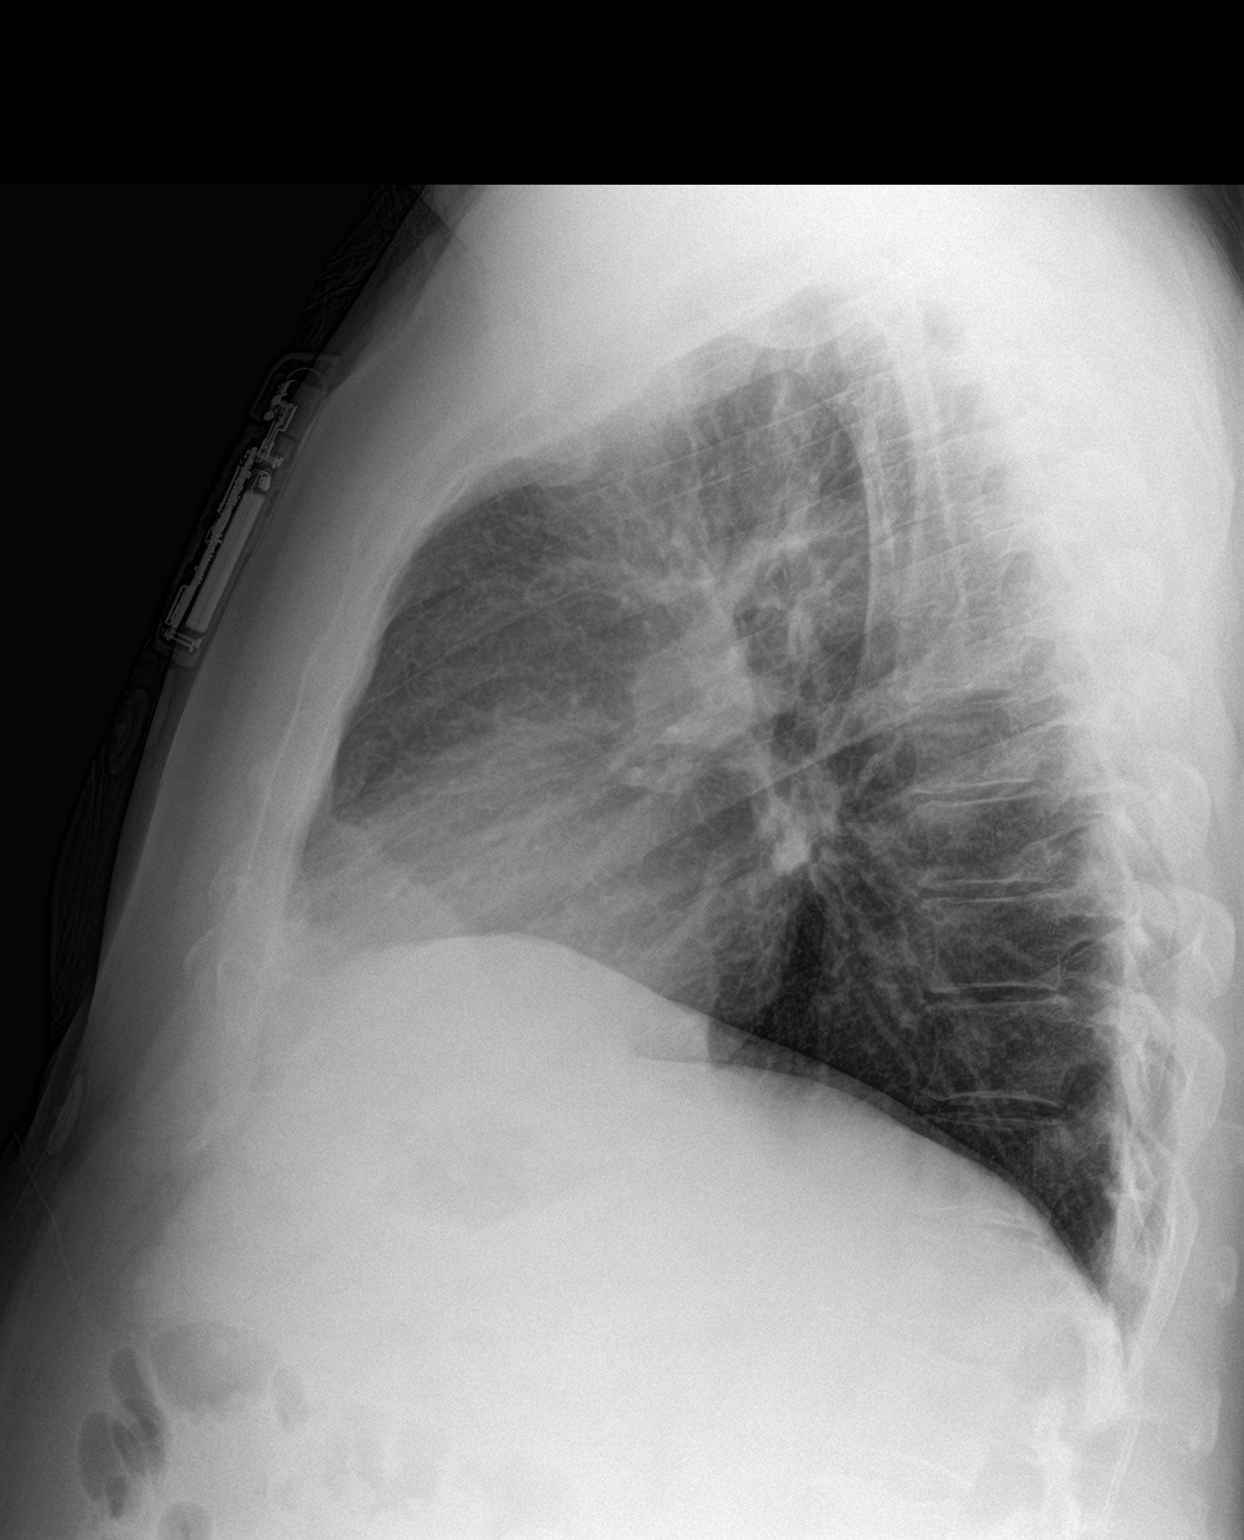

[2 of 2 positions shown; findings below may reference images not displayed]

FINDINGS: The heart size and mediastinal contours are within normal limits.
Both lungs are clear. The visualized skeletal structures are
unremarkable.
IMPRESSION: No active cardiopulmonary disease.

## 2020-11-20 IMAGING — CT CT HEART MORP W/ CTA COR W/ SCORE W/ CA W/CM &/OR W/O CM
2 of 8 series · 4 of 20 positions shown, 5 images · IV contrast (APPLIED)
Comparison: None.

Addendum:
CLINICAL DATA: 58M presents with chest pain

EXAM:
Cardiac/Coronary CTA
TECHNIQUE: The patient was scanned on a Phillips Force scanner.

[Series 9: ts syst sharp 31 % · axial · 0.39mm/px · z∈[-112,-68]mm · 2 of 331 slices shown]
[im 111/331  lung]
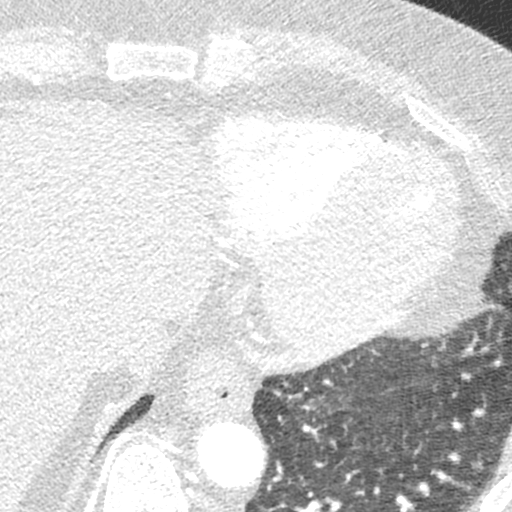
[im 221/331  lung]
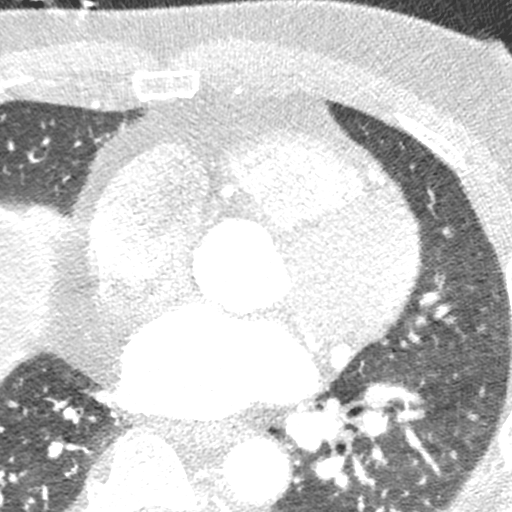

[Series 13: ts diast 73 % · axial · 0.39mm/px · z∈[-112,-68]mm · 2 of 331 slices shown, 3 images]
[im 111/331  vessel]
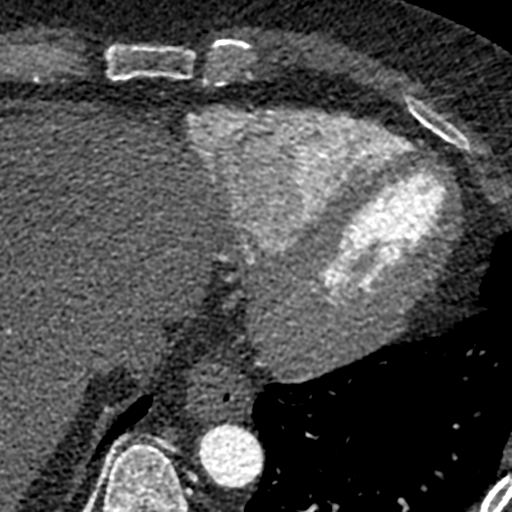
[im 111/331  lung]
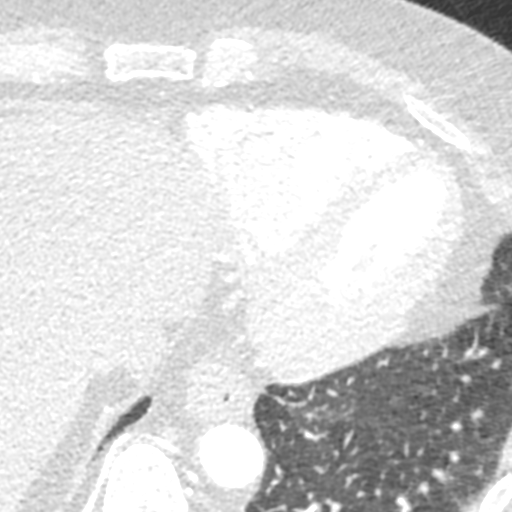
[im 221/331  vessel]
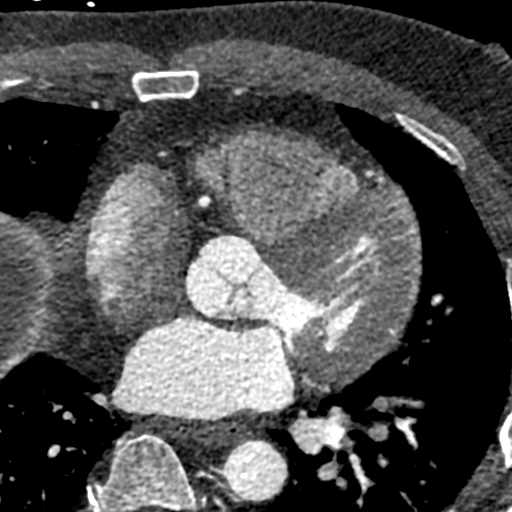

[4 of 20 positions shown; findings below may reference images not displayed]



Coronary Arteries:  Normal coronary origin.  Right dominance.

RCA is a large dominant artery that gives rise to PDA and PLA. There
is noncalcified plaque at the ostium of the RCA causing mild
(25-49%) stenosis

Left main is a large artery that gives rise to LAD and LCX arteries.

LAD is a large vessel. Calcified plaque in the proximal LAD causes
minimal (0-24%) stenosis. Noncalcified plaque in the proximal LAD
causes severe (70-99%) stenosis

LCX is a non-dominant artery that gives rise to one large OM1
branch. There is no plaque.

Other findings:

Left Ventricle: Normal size

Left Atrium: Mild enlargement

Pulmonary Veins: Normal configuration

Right Ventricle: Moderate dilatation

Right Atrium: Mild enlargement

Cardiac valves: No calcifications

Thoracic aorta: Normal size

Pulmonary Arteries: Normal size

Systemic Veins: Normal drainage

Pericardium: Normal thickness
IMPRESSION: 1. Noncalcified plaque in the proximal LAD causes severe (70-99%)
stenosis

2. Noncalcified plaque at the ostium of the RCA causes mild (25-49%)
stenosis

3. Coronary calcium score of 17. This was 49th percentile for age
and sex matched control.

CAD-RADS 4 Severe stenosis. (70-99%). Cardiac catheterization is
recommended. Consider symptom-guided anti-ischemic pharmacotherapy
as well as risk factor modification per guideline directed care

EXAM:
OVER-READ INTERPRETATION  CT CHEST

The following report is an over-read performed by radiologist Dr.
over-read does not include interpretation of cardiac or coronary
anatomy or pathology. The coronary CTA interpretation by the
cardiologist is attached.
FINDINGS: Cardiovascular: Heart size normal.  No pericardial effusion.

Mediastinum/nodes: No mass or adenopathy.  Small hiatal hernia.

Lungs/pleura: No pleural effusion identified. No airspace
consolidation, atelectasis or pneumothorax identified.

Upper abdomen: No acute abnormality.

Musculoskeletal: No acute or significant osseous findings
identified.
IMPRESSION: 1. Negative over read.

*** End of Addendum ***
FINDINGS: A 100 kV prospective scan was triggered in the descending thoracic
aorta at 111 HU's. Axial non-contrast 3 mm slices were carried out
through the heart. The data set was analyzed on a dedicated work
station and scored using the Agatson method. Gantry rotation speed
was 250 msecs and collimation was .6 mm. No beta blockade and 0.8 mg
of sl NTG was given. The 3D data set was reconstructed in 5%
intervals of the 67-82 % of the R-R cycle. Diastolic phases were
analyzed on a dedicated work station using MPR, MIP and VRT modes.
The patient received 80 cc of contrast.

Coronary Arteries:  Normal coronary origin.  Right dominance.

RCA is a large dominant artery that gives rise to PDA and PLA. There
is noncalcified plaque at the ostium of the RCA causing mild
(25-49%) stenosis

Left main is a large artery that gives rise to LAD and LCX arteries.

LAD is a large vessel. Calcified plaque in the proximal LAD causes
minimal (0-24%) stenosis. Noncalcified plaque in the proximal LAD
causes severe (70-99%) stenosis

LCX is a non-dominant artery that gives rise to one large OM1
branch. There is no plaque.

Other findings:

Left Ventricle: Normal size

Left Atrium: Mild enlargement

Pulmonary Veins: Normal configuration

Right Ventricle: Moderate dilatation

Right Atrium: Mild enlargement

Cardiac valves: No calcifications

Thoracic aorta: Normal size

Pulmonary Arteries: Normal size

Systemic Veins: Normal drainage

Pericardium: Normal thickness
IMPRESSION: 1. Noncalcified plaque in the proximal LAD causes severe (70-99%)
stenosis

2. Noncalcified plaque at the ostium of the RCA causes mild (25-49%)
stenosis

3. Coronary calcium score of 17. This was 49th percentile for age
and sex matched control.

CAD-RADS 4 Severe stenosis. (70-99%). Cardiac catheterization is
recommended. Consider symptom-guided anti-ischemic pharmacotherapy
as well as risk factor modification per guideline directed care

## 2020-12-10 ENCOUNTER — Other Ambulatory Visit: Payer: Self-pay

## 2020-12-10 MED ORDER — METOPROLOL TARTRATE 50 MG PO TABS: 50.0000 mg | ORAL_TABLET | Freq: Two times a day (BID) | ORAL | 0 refills | Status: DC

## 2020-12-10 MED ORDER — PRASUGREL HCL 10 MG PO TABS: 10.0000 mg | ORAL_TABLET | Freq: Every day | ORAL | 0 refills | Status: DC

## 2021-02-20 ENCOUNTER — Other Ambulatory Visit: Payer: Self-pay | Admitting: Cardiology

## 2021-03-18 ENCOUNTER — Telehealth: Payer: Self-pay | Admitting: Cardiology

## 2021-03-18 NOTE — Progress Notes (Signed)
Cardiology Office Note:    Date:  03/20/2021   ID:  Gabriel Jackson, DOB [birth year], MRN [id number]  PCP:  Lawerance Cruel, MD   [facility] Providers Cardiologist:  Fransico Him, MD {   Referring MD: Lawerance Cruel, MD     History of Present Illness:    Gabriel Jackson is a 61 y.o. male with a hx of CAD s/p PCI to LAD, HTN, GER and anxiety who is regularly followed by Dr. Radford Pax who presents to clinic for pre-operative evaluation.  Per review of the record, he was seen by Dr. Marlou Porch in 2017 with complaints of chest pain and palpitations and CP was felt to be atypical.  ETT showed no ischemia.  Was seen by Dr. Radford Pax in 07/2019 due to fatigue and SOB with minimal exertion.  He also was having palpitations.  Coronary CTA showed severe noncalcified plaque in the proximal LAD and went on to cardiac cath on 05/2019 which showed severe single vessel disease with sequential 95%, 60% and 30% mid LAD stenosis and mild non-obstructive RCA, normal LV filling pressures.  He underwent PCI of the 95% and 60% LAD stenosis with a jailed D2 branch.  2D echo was normal.  Today, the patient feels overall well. He has been suffering from left knee pain and is plan for scope with orthopedics next month. Otherwise, he denies any exertional symptoms. He is overall active and is able to walk a flight of stairs and walk 14,000 steps/day without issues. No chest pain, SOB, orthopnea, PND, LE edema, lightheadedness, or dizziness. Has been compliant with medications. Cut his lipitor into 40mg  due to muscle aches.   Past Medical History:  Diagnosis Date   Anxiety    CAD (coronary artery disease)    Cath 06/06/19 (admx w [place]) >> mLAD 95, 60, 30; pRCA 30, mRCA 30 >> PCI:  DES to mLAD, D2 jailed by stent // Echo 05/2019: EF 60-65, mild asymmetric LVH, trivial MR, normal RVSf    GERD (gastroesophageal reflux disease)    HTN (hypertension)    Hyperlipidemia    OA (osteoarthritis)    PAT (paroxysmal atrial  tachycardia) ([place])    noted on event monitor 03/2020    Past Surgical History:  Procedure Laterality Date   CARDIAC CATHETERIZATION     COLONOSCOPY     CORONARY STENT INTERVENTION N/A 06/06/2019   Procedure: CORONARY STENT INTERVENTION;  Surgeon: Nelva Bush, MD;  Location: [facility];  Service: Cardiovascular;  Laterality: N/A;   LEFT HEART CATH AND CORONARY ANGIOGRAPHY N/A 06/06/2019   Procedure: LEFT HEART CATH AND CORONARY ANGIOGRAPHY;  Surgeon: Nelva Bush, MD;  Location: [facility];  Service: Cardiovascular;  Laterality: N/A;   VASECTOMY      Current Medications: Current Meds  Medication Sig   amLODipine (NORVASC) 10 MG tablet Take 1 tablet (10 mg total) by mouth daily.   aspirin EC 81 MG tablet Take 1 tablet (81 mg total) by mouth daily.   atorvastatin (LIPITOR) 80 MG tablet Take 1 tablet (80 mg total) by mouth daily at 6 PM.   Cholecalciferol (VITAMIN D3 PO) Take 1 capsule by mouth as directed.   Cyanocobalamin (VITAMIN B-12 PO) Take 1 tablet by mouth as directed.   Glucosamine HCl (GLUCOSAMINE PO) Take 2 tablets by mouth daily.    metoprolol tartrate (LOPRESSOR) 50 MG tablet Take 1 tablet (50 mg total) by mouth 2 (two) times daily.   Multiple Vitamin (MULTIVITAMIN) capsule Take 1 capsule  by mouth daily.   Multiple Vitamins-Minerals (EMERGEN-C IMMUNE PO) Take 1 tablet by mouth daily.   nitroGLYCERIN (NITROSTAT) 0.4 MG SL tablet Place 1 tablet (0.4 mg total) under the tongue every 5 (five) minutes x 3 doses as needed for chest pain.   olmesartan (BENICAR) 40 MG tablet Take 1 tablet (40 mg total) by mouth daily.   omeprazole (PRILOSEC) 20 MG capsule Take 20 mg by mouth daily.   prasugrel (EFFIENT) 10 MG TABS tablet Take 1 tablet (10 mg total) by mouth daily. Please make yearly appt with Dr. Radford Pax for January 2023 for future refills. Thank you 1st attempt   zinc gluconate 50 MG tablet Take 50 mg by mouth daily.     Allergies:   Proair hfa [albuterol],  Zithromax [azithromycin], and Other   Social History   Socioeconomic History   Marital status: Married    Spouse name: Not on file   Number of children: Not on file   Years of education: 14   Highest education level: Associate degree: occupational, Hotel manager, or vocational program  Occupational History   Not on file  Tobacco Use   Smoking status: Never   Smokeless tobacco: Never  Vaping Use   Vaping Use: Never used  Substance and Sexual Activity   Alcohol use: Yes    Comment: beer on rare occasions   Drug use: No   Sexual activity: Not on file  Other Topics Concern   Not on file  Social History Narrative   Not on file   Social Determinants of Health   Financial Resource Strain: Not on file  Food Insecurity: Not on file  Transportation Needs: Not on file  Physical Activity: Not on file  Stress: Not on file  Social Connections: Not on file     Family History: The patient's family history includes Diabetes in his father; Healthy in his brother.  ROS:   Please see the history of present illness.    Review of Systems  Constitutional:  Negative for malaise/fatigue.  Respiratory:  Negative for shortness of breath.   Cardiovascular:  Negative for chest pain, palpitations, orthopnea, claudication, leg swelling and PND.  Gastrointestinal:  Negative for blood in stool and melena.  Genitourinary:  Negative for hematuria.  Musculoskeletal:  Positive for joint pain.  Neurological:  Negative for dizziness and loss of consciousness.    EKGs/Labs/Other Studies Reviewed:    The following studies were reviewed today: Myoview 03/22/20: The left ventricular ejection fraction is mildly decreased (45-54%). Nuclear stress EF: 53%. There was no ST segment deviation noted during stress. The study is normal. This is a low risk study.   Normal resting and stress perfusion. No ischemia or infarction EF 53%  Cardiac Monitor 04/11/2020: Predominant rhythm is normal sinus rhythm with  average heart rate 72bpm and ranged fro 47-168bpm. Nonsustained atrial tachycardia with shortest episode lasting 5 beats at 182bpm and longest episode lasting 15 beats. Ventricular couplet     Patch Wear Time:  13 days and 22 hours (2022-01-13T16:44:01-499 to 2022-01-27T14:57:10-498)   Patient had a min HR of 47 bpm, max HR of 182 bpm, and avg HR of 72 bpm. Predominant underlying rhythm was Sinus Rhythm. 4 Supraventricular Tachycardia runs occurred, the run with the fastest interval lasting 5 beats with a max rate of 182 bpm, the  longest lasting 15 beats with an avg rate of 151 bpm. Isolated SVEs were rare (<1.0%), SVE Couplets were rare (<1.0%), and SVE Triplets were rare (<1.0%). Isolated VEs were rare (<  1.0%), VE Couplets were rare (<1.0%), and no VE Triplets were present.   Cath 05/2019: Conclusions: Significant single-vessel coronary artery disease, with sequential 95%, 60%, and 30% mid LAD stenoses. Mild, non-obstructive coronary artery disease involving the RCA. Normal left ventricular filling pressure. Successful PCI to sequential 95% and 60% mid LAD stenoses using Synergy 3.0 x 32 mm drug-eluting stent (post-dilated to 3.6 mm) with 0% residual stenosis and TIMI-3 flow.  Jailed D2 branch shows TIMI-3 flow after the intervention.   Recommendations: Dual antiplatelet therapy with aspirin and prasugrel for at least 12 months. Aggressive secondary prevention.  TTE 05/16/19: IMPRESSIONS     1. Left ventricular ejection fraction, by estimation, is 60 to 65%. The  left ventricle has normal function. The left ventricle has no regional  wall motion abnormalities. There is mild asymmetric left ventricular  hypertrophy of the basal-septal segment.  Left ventricular diastolic parameters were normal.   2. Right ventricular systolic function is normal. The right ventricular  size is normal. There is normal pulmonary artery systolic pressure.   3. The mitral valve is normal in structure.  Trivial mitral valve  regurgitation. No evidence of mitral stenosis.   4. The aortic valve is tricuspid. Aortic valve regurgitation is not  visualized. No aortic stenosis is present.   5. The inferior vena cava is normal in size with greater than 50%  respiratory variability, suggesting right atrial pressure of 3 mmHg.   Comparison(s): No prior Echocardiogram.   EKG:  EKG is  ordered today.  The ekg ordered today demonstrates sinus bradycardia with HR 55  Recent Labs: No results found for requested labs within last 8760 hours.  Recent Lipid Panel    Component Value Date/Time   CHOL 83 (L) 08/16/2019 0737   TRIG 72 08/16/2019 0737   HDL 30 (L) 08/16/2019 0737   CHOLHDL 2.8 08/16/2019 0737   CHOLHDL 4.8 06/03/2019 1952   VLDL 13 06/03/2019 1952   LDLCALC 37 08/16/2019 0737     Risk Assessment/Calculations:           Physical Exam:    VS:  BP 132/84    Pulse (!) 55    Ht 5\' 11"  (1.803 m)    Wt 237 lb (107.5 kg)    SpO2 98%    BMI 33.05 kg/m     Wt Readings from Last 3 Encounters:  03/20/21 237 lb (107.5 kg)  06/12/20 210 lb (95.3 kg)  03/22/20 226 lb (102.5 kg)     GEN:  Well nourished, well developed in no acute distress HEENT: Normal NECK: No JVD; No carotid bruits CARDIAC: Bradycardic, regular, no murmurs, rubs, gallops RESPIRATORY:  Clear to auscultation without rales, wheezing or rhonchi  ABDOMEN: Soft, non-tender, non-distended MUSCULOSKELETAL:  No edema; No deformity  SKIN: Warm and dry NEUROLOGIC:  Alert and oriented x 3 PSYCHIATRIC:  Normal affect   ASSESSMENT:    1. Preop examination   2. Coronary artery disease involving native coronary artery of native heart without angina pectoris   3. Essential hypertension   4. Hyperlipidemia with target LDL less than 70    PLAN:    In order of problems listed above:  #Pre-operative Evaluation: Patient is planned for left knee scope with orthopedics in 04/2021. Currently, the patient is stable from a CV  standpoint with no anginal symptoms. He remains active and is able to complete >4METs without issues. Myoview 03/2020 with normal perfusion. RSRI 6%. No further testing needed prior to procedure. Okay to hold DAPT  prior to procedure. -No further cardiac testing needed prior to surgery -Okay to hold DAPT prior to procedure and resume when able post-procedure  #CAD s/p PCI: Cardiac cath on 05/2019 showed severe single vessel disease with sequential 95%, 60% and 30% mid LAD stenosis and mild non-obstructive RCA, normal LV filling pressures.  S/p PCI of the 95% and 60% LAD stenosis with a jailed D2 branch.  TTE with normal EF. Myoview 03/2020 with no ischemia. No anginal symptoms. -Continue ASA 81mg , prasugrel 10mg  daily; okay to hold for surgery -Continue metop 50mg  BID -Continue lipitor 40mg  daily -Nitro PRN  #HTN: Controlled at home. Goal <130/90s. -Continue amlopdine 10mg  daily -Continue olmesartan 40mg  daily -Continue metop 50mg  BID  #HLD: -Continue lipitor 40mg  daily -Check lipid panel -Goal LDL<55       Medication Adjustments/Labs and Tests Ordered: Current medicines are reviewed at length with the patient today.  Concerns regarding medicines are outlined above.  Orders Placed This Encounter  Procedures   Lipid Profile   EKG 12-Lead   No orders of the defined types were placed in this encounter.   Patient Instructions  Medication Instructions:   Your physician recommends that you continue on your current medications as directed. Please refer to the Current Medication list given to you today.  *If you need a refill on your cardiac medications before your next appointment, please call your pharmacy*   Lab Work:  TODAY--LIPIDS  If you have labs (blood work) drawn today and your tests are completely normal, you will receive your results only by: [place] (if you have MyChart) OR A paper copy in the mail If you have any lab test that is abnormal or we need to  change your treatment, we will call you to review the results.    Follow-Up: At [facility], you and your health needs are our priority.  As part of our continuing mission to provide you with exceptional heart care, we have created designated Provider Care Teams.  These Care Teams include your primary Cardiologist (physician) and Advanced Practice Providers (APPs -  Physician Assistants and Nurse Practitioners) who all work together to provide you with the care you need, when you need it.  We recommend signing up for the patient portal called "MyChart".  Sign up information is provided on this After Visit Summary.  MyChart is used to connect with patients for Virtual Visits (Telemedicine).  Patients are able to view lab/test results, encounter notes, upcoming appointments, etc.  Non-urgent messages can be sent to your provider as well.   To learn more about what you can do with MyChart, go to [email or web address].    Your next appointment:   6 month(s)  The format for your next appointment:   In Person  Provider:   Fransico Him, MD {     Signed, Freada Bergeron, MD  03/20/2021 3:36 PM    [place]

## 2021-03-18 NOTE — Telephone Encounter (Signed)
Pt has appointment 03/20/21 with Dr Johney Frame. Will forward notes to MD for appointment 03/20/21. Will send FYI to requesting office. Pt has appointment.

## 2021-03-18 NOTE — Telephone Encounter (Signed)
° °  Pre-operative Risk Assessment    Patient Name: Gabriel Jackson  DOB: [birth year] MRN: [id number]      Request for Surgical Clearance    Procedure:   LEFT KNEE SCOPE  Date of Surgery:  Clearance 04/19/21                                 Surgeon:  Dr. Frederik Pear Surgeon's Group or Practice Name:  [facility] Phone number:  [phone] Fax number:  [phone]   Type of Clearance Requested:   - Medical  - Pharmacy:  Hold Deferring to Cardiology      Type of Anesthesia:   choice   Additional requests/questions:   n/a  Signed, Kamira J Martinique   03/18/2021, 12:53 PM

## 2021-03-18 NOTE — Telephone Encounter (Signed)
Primary Cardiologist:Traci Turner, MD  Chart reviewed as part of pre-operative protocol coverage. Because of Gabriel Jackson past medical history and time since last visit, he/she will require a follow-up visit in order to better assess preoperative cardiovascular risk.  Pre-op covering staff: - Please schedule appointment and call patient to inform them. - Please contact requesting surgeon's office via preferred method (i.e, phone, fax) to inform them of need for appointment prior to surgery.  If applicable, this message will also be routed to pharmacy pool and/or primary cardiologist for input on holding anticoagulant/antiplatelet agent as requested below so that this information is available at time of patient's appointment.   Deberah Pelton, NP  03/18/2021, 12:56 PM

## 2021-03-20 ENCOUNTER — Ambulatory Visit: Payer: BC Managed Care – PPO | Admitting: Cardiology

## 2021-03-20 ENCOUNTER — Other Ambulatory Visit: Payer: Self-pay

## 2021-03-20 ENCOUNTER — Encounter: Payer: Self-pay | Admitting: Cardiology

## 2021-03-20 VITALS — BP 132/84 | HR 55 | Ht 71.0 in | Wt 237.0 lb

## 2021-03-20 DIAGNOSIS — E785 Hyperlipidemia, unspecified: Secondary | ICD-10-CM

## 2021-03-20 DIAGNOSIS — Z01818 Encounter for other preprocedural examination: Secondary | ICD-10-CM | POA: Diagnosis not present

## 2021-03-20 DIAGNOSIS — I10 Essential (primary) hypertension: Secondary | ICD-10-CM

## 2021-03-20 DIAGNOSIS — I2510 Atherosclerotic heart disease of native coronary artery without angina pectoris: Secondary | ICD-10-CM | POA: Diagnosis not present

## 2021-03-20 NOTE — Patient Instructions (Signed)
Medication Instructions:   Your physician recommends that you continue on your current medications as directed. Please refer to the Current Medication list given to you today.  *If you need a refill on your cardiac medications before your next appointment, please call your pharmacy*   Lab Work:  TODAY--LIPIDS  If you have labs (blood work) drawn today and your tests are completely normal, you will receive your results only by: [place] (if you have MyChart) OR A paper copy in the mail If you have any lab test that is abnormal or we need to change your treatment, we will call you to review the results.    Follow-Up: At [facility], you and your health needs are our priority.  As part of our continuing mission to provide you with exceptional heart care, we have created designated Provider Care Teams.  These Care Teams include your primary Cardiologist (physician) and Advanced Practice Providers (APPs -  Physician Assistants and Nurse Practitioners) who all work together to provide you with the care you need, when you need it.  We recommend signing up for the patient portal called "MyChart".  Sign up information is provided on this After Visit Summary.  MyChart is used to connect with patients for Virtual Visits (Telemedicine).  Patients are able to view lab/test results, encounter notes, upcoming appointments, etc.  Non-urgent messages can be sent to your provider as well.   To learn more about what you can do with MyChart, go to [email or web address].    Your next appointment:   6 month(s)  The format for your next appointment:   In Person  Provider:   Fransico Him, MD {

## 2021-03-21 LAB — LIPID PANEL
Chol/HDL Ratio: 2.8 ratio (ref 0.0–5.0)
Cholesterol, Total: 105 mg/dL (ref 100–199)
HDL: 37 mg/dL — ABNORMAL LOW (ref >39)
LDL Chol Calc (NIH): 57 mg/dL (ref 0–99)
Triglycerides: 42 mg/dL (ref 0–149)
VLDL Cholesterol Cal: 11 mg/dL (ref 5–40)

## 2021-03-21 NOTE — Telephone Encounter (Signed)
Patient is cleared from a CV perspective to undergo surgery. No further CV testing needed prior to procedure. Okay to hold DAPT and resume once cleared from a surgical perspective.

## 2021-03-21 NOTE — Telephone Encounter (Signed)
Will route this back to our pre-op pool for any final notes.

## 2021-03-21 NOTE — Telephone Encounter (Signed)
Dr. Jacolyn Reedy office visit note has been faxed to [facility], Dr. Mayer Camel at [phone]

## 2021-03-27 ENCOUNTER — Other Ambulatory Visit: Payer: Self-pay | Admitting: Cardiology

## 2021-04-18 ENCOUNTER — Other Ambulatory Visit: Payer: Self-pay | Admitting: Cardiology

## 2021-05-01 ENCOUNTER — Ambulatory Visit: Payer: BLUE CROSS/BLUE SHIELD | Admitting: Physician Assistant

## 2021-05-27 ENCOUNTER — Other Ambulatory Visit: Payer: Self-pay

## 2021-05-27 MED ORDER — METOPROLOL TARTRATE 50 MG PO TABS: 50.0000 mg | ORAL_TABLET | Freq: Two times a day (BID) | ORAL | 3 refills | Status: DC

## 2021-12-31 ENCOUNTER — Telehealth: Payer: Self-pay | Admitting: Cardiology

## 2021-12-31 NOTE — Telephone Encounter (Signed)
Spoke with pt and advised if lab needs to be ordered Margaret Pyle will determine at his appt tomorrow.  Pt has had recent labs at his PCP (Dr Melinda Crutch) and will attempt to obtain those results to bring with him to the appointment.

## 2021-12-31 NOTE — Telephone Encounter (Signed)
Patient has follow up scheduled for tomorrow with Richardson Dopp, PA but wants to know if he should do labs before seeing Scott. Please advise.

## 2022-01-01 ENCOUNTER — Ambulatory Visit: Payer: 59 | Attending: Physician Assistant | Admitting: Physician Assistant

## 2022-01-01 ENCOUNTER — Encounter: Payer: Self-pay | Admitting: Physician Assistant

## 2022-01-01 VITALS — BP 124/72 | HR 66 | Ht 71.0 in | Wt 232.6 lb

## 2022-01-01 DIAGNOSIS — I2510 Atherosclerotic heart disease of native coronary artery without angina pectoris: Secondary | ICD-10-CM | POA: Diagnosis not present

## 2022-01-01 DIAGNOSIS — E785 Hyperlipidemia, unspecified: Secondary | ICD-10-CM | POA: Diagnosis not present

## 2022-01-01 DIAGNOSIS — I4719 Other supraventricular tachycardia: Secondary | ICD-10-CM

## 2022-01-01 DIAGNOSIS — I10 Essential (primary) hypertension: Secondary | ICD-10-CM | POA: Diagnosis not present

## 2022-01-01 MED ORDER — CLOPIDOGREL BISULFATE 75 MG PO TABS: 75.0000 mg | ORAL_TABLET | Freq: Every day | ORAL | 3 refills | Status: DC

## 2022-01-01 MED ORDER — AMLODIPINE BESYLATE 10 MG PO TABS: 10.0000 mg | ORAL_TABLET | Freq: Every day | ORAL | 3 refills | Status: DC

## 2022-01-01 MED ORDER — EZETIMIBE 10 MG PO TABS: 10.0000 mg | ORAL_TABLET | Freq: Every day | ORAL | 3 refills | Status: DC

## 2022-01-01 MED ORDER — METOPROLOL TARTRATE 50 MG PO TABS: 50.0000 mg | ORAL_TABLET | Freq: Two times a day (BID) | ORAL | 3 refills | Status: DC

## 2022-01-01 MED ORDER — NITROGLYCERIN 0.4 MG SL SUBL: 0.4000 mg | SUBLINGUAL_TABLET | SUBLINGUAL | 3 refills | Status: AC | PRN

## 2022-01-01 MED ORDER — OLMESARTAN MEDOXOMIL 40 MG PO TABS: 40.0000 mg | ORAL_TABLET | Freq: Every day | ORAL | 3 refills | Status: AC

## 2022-01-01 NOTE — Patient Instructions (Signed)
Medication Instructions:  Your physician has recommended you make the following change in your medication:   STOP Aspirin  STOP Atorvastatin  STOP Effient  START Plavix 74 mg taking  1 daily  START Zetia 10 mg taking 1 daily   *If you need a refill on your cardiac medications before your next appointment, please call your pharmacy*   Lab Work: 3 MONTHS:  FASTING LIPID & LFT  If you have labs (blood work) drawn today and your tests are completely normal, you will receive your results only by: [place] (if you have MyChart) OR A paper copy in the mail If you have any lab test that is abnormal or we need to change your treatment, we will call you to review the results.   Testing/Procedures: None ordered   Follow-Up: At [facility], you and your health needs are our priority.  As part of our continuing mission to provide you with exceptional heart care, we have created designated Provider Care Teams.  These Care Teams include your primary Cardiologist (physician) and Advanced Practice Providers (APPs -  Physician Assistants and Nurse Practitioners) who all work together to provide you with the care you need, when you need it.  We recommend signing up for the patient portal called "MyChart".  Sign up information is provided on this After Visit Summary.  MyChart is used to connect with patients for Virtual Visits (Telemedicine).  Patients are able to view lab/test results, encounter notes, upcoming appointments, etc.  Non-urgent messages can be sent to your provider as well.   To learn more about what you can do with MyChart, go to [email or web address].    Your next appointment:   6 month(s)  The format for your next appointment:   In Person  Provider:   Fransico Him, MD     Other Instructions   Important Information About Sugar

## 2022-01-01 NOTE — Assessment & Plan Note (Signed)
Overall well controlled.  Continue metoprolol tartrate 50 mg twice daily.

## 2022-01-01 NOTE — Assessment & Plan Note (Signed)
He has been having myalgias with statins.  He reduced his atorvastatin to 40 mg several months ago.  He would like to get off of statin therapy if at all possible.  We discussed goals for lipid management with LDL <55.  We discussed alternative therapy such as PCSK9 inhibitors and siRNA.  DC atorvastatin Start Zetia 10 mg daily Lipids, LFTs in 3 months If LDL above target, refer to lipid clinic for PCSK9 inhibitor

## 2022-01-01 NOTE — Progress Notes (Signed)
Cardiology Office Note:    Date:  01/01/2022   ID:  Gabriel Jackson, DOB 1960/05/08, MRN [id number]  PCP:  Lawerance Cruel, [facility] Cardiologist:  Fransico Him, MD    Referring MD: Lawerance Cruel, MD   Chief Complaint:  F/u for CAD    Patient Profile: Coronary artery disease  [place] 05/2019:  S/p DES to LAD (jailed D2 branch) Myoview 1/22: low risk  Echocardiogram 3/21: EF 60-65 Paroxysmal atrial tachycardia  Monitor 2/22: Longest episode 15 beats GERD Hypertension  Hyperlipidemia  Anxiety   Prior CV Studies: LONG TERM MONITOR (8-14 DAYS) INTERPRETATION 04/10/2020  Predominant rhythm is normal sinus rhythm with average heart rate 72bpm and ranged fro 47-168bpm.  Nonsustained atrial tachycardia with shortest episode lasting 5 beats at 182bpm and longest episode lasting 15 beats.  Ventricular couplet  GATED SPECT MYO PERF W/LEXISCAN STRESS 1D 03/22/2020  The left ventricular ejection fraction is mildly decreased (45-54%).  Nuclear stress EF: 53%.  There was no ST segment deviation noted during stress.  The study is normal.  This is a low risk study. Normal resting and stress perfusion. No ischemia or infarction EF 53%   NON-TELEMETRY MONITORING-INTERPRETATION ONLY 06/23/2019  Sinus bradycardia, normal sinus rhythm and sinus tachycardia. The average heart rate was 83bpm and ranged from 54-160bpm.  Occasional PACs     Cardiac catheterization 06/06/19 LAD mid 95, 60, 30 RCA prox 30, mid 30 PCI:  3 x 32 mm Synergy DES to mLAD, jailed D2 branch   Echocardiogram 05/16/19 EF 60-65, no RWMA, mild asymmetric LVH, normal RVSF, normal RVSP, trivial MR  History of Present Illness:   Gabriel Jackson is a 61 y.o. male with the above problem list.  He was last seen in clinic by Dr. Johney Frame in January 2023 for surgical clearance.  He returns for follow-up.  He is here alone.  Overall, he is doing well without exert chest pain, syncope,  orthopnea.  He does have some dependent leg edema that improves with elevation.  He does note shortness of breath with more moderate to extreme activities.  He attributes this to being out of shape.  He has not had any significant change.  He remains very active.  He can lift bags of feed that weigh 100 lbs without difficulty.        Past Medical History:  Diagnosis Date   Anxiety    CAD (coronary artery disease)    Cath 06/06/19 (admx w [place]) >> mLAD 95, 60, 30; pRCA 30, mRCA 30 >> PCI:  DES to mLAD, D2 jailed by stent // Echo 05/2019: EF 60-65, mild asymmetric LVH, trivial MR, normal RVSf    GERD (gastroesophageal reflux disease)    HTN (hypertension)    Hyperlipidemia    OA (osteoarthritis)    PAT (paroxysmal atrial tachycardia)    noted on event monitor 03/2020   Current Medications: Current Meds  Medication Sig   Cholecalciferol (VITAMIN D3 PO) Take 1 capsule by mouth as directed.   clopidogrel (PLAVIX) 75 MG tablet Take 1 tablet (75 mg total) by mouth daily.   Cyanocobalamin (VITAMIN B-12 PO) Take 1 tablet by mouth as directed.   ezetimibe (ZETIA) 10 MG tablet Take 1 tablet (10 mg total) by mouth daily.   Multiple Vitamin (MULTIVITAMIN) capsule Take 1 capsule by mouth daily.   pantoprazole (PROTONIX) 40 MG tablet Take 40 mg by mouth every morning.   Turmeric (QC TUMERIC COMPLEX) 500 MG CAPS Take 500  mg by mouth in the morning and at bedtime.   zinc gluconate 50 MG tablet Take 50 mg by mouth daily.   [DISCONTINUED] amLODipine (NORVASC) 10 MG tablet Take 1 tablet (10 mg total) by mouth daily.   [DISCONTINUED] aspirin EC 81 MG tablet Take 1 tablet (81 mg total) by mouth daily.   [DISCONTINUED] atorvastatin (LIPITOR) 80 MG tablet Patient takes 1/2 tablet (40 mg total) by mouth every other day   [DISCONTINUED] metoprolol tartrate (LOPRESSOR) 50 MG tablet Take 1 tablet (50 mg total) by mouth 2 (two) times daily.   [DISCONTINUED] nitroGLYCERIN (NITROSTAT) 0.4 MG SL tablet Place 1 tablet (0.4  mg total) under the tongue every 5 (five) minutes x 3 doses as needed for chest pain.   [DISCONTINUED] olmesartan (BENICAR) 40 MG tablet TAKE 1 TABLET BY MOUTH EVERY DAY   [DISCONTINUED] prasugrel (EFFIENT) 10 MG TABS tablet Take 1 tablet (10 mg total) by mouth daily.    Allergies:   Proair hfa [albuterol], Zithromax [azithromycin], and Other   Social History   Tobacco Use   Smoking status: Never   Smokeless tobacco: Never  Vaping Use   Vaping Use: Never used  Substance Use Topics   Alcohol use: Yes    Comment: beer on rare occasions   Drug use: No    Family Hx: The patient's family history includes Diabetes in his father; Healthy in his brother.  Review of Systems  Musculoskeletal:  Positive for myalgias.     EKGs/Labs/Other Test Reviewed:    EKG:  EKG is not ordered today.   Recent Labs: No results found for requested labs within last 365 days.   Recent Lipid Panel Recent Labs    03/20/21 1511  CHOL 105  TRIG 42  HDL 37*  LDLCALC 57     Risk Assessment/Calculations/Metrics:              Physical Exam:    VS:  BP 124/72   Pulse 66   Ht 5\' 11"  (1.803 m)   Wt 232 lb 9.6 oz (105.5 kg)   SpO2 98%   BMI 32.44 kg/m     Wt Readings from Last 3 Encounters:  01/01/22 232 lb 9.6 oz (105.5 kg)  03/20/21 237 lb (107.5 kg)  06/12/20 210 lb (95.3 kg)    Constitutional:      Appearance: Healthy appearance. Not in distress.  Neck:     Vascular: No carotid bruit. JVD normal.  Pulmonary:     Effort: Pulmonary effort is normal.     Breath sounds: No wheezing. No rales.  Cardiovascular:     Normal rate. Regular rhythm. Normal S1. Normal S2.      Murmurs: There is no murmur.  Edema:    Peripheral edema absent.  Abdominal:     Palpations: Abdomen is soft.  Skin:    General: Skin is warm and dry.  Neurological:     General: No focal deficit present.     Mental Status: Alert and oriented to person, place and time.         ASSESSMENT & PLAN:   Coronary  artery disease involving native coronary artery of native heart without angina pectoris Status post DES to the LAD in 2021.  He has been on aspirin and Effient since that time.  Since he is greater than 12 months out from his PCI, we can reduce his regimen to a single antiplatelet agent.  His stent length is 32 mm.  Therefore, I think we should  keep him on P2 Y12 inhibitor.  He is currently doing well without angina. DC aspirin, Effient Start Plavix 75 mg daily Continue metoprolol 50 mg twice daily, as needed nitroglycerin Follow-up in 6 months  Hyperlipidemia with target LDL less than 70 He has been having myalgias with statins.  He reduced his atorvastatin to 40 mg several months ago.  He would like to get off of statin therapy if at all possible.  We discussed goals for lipid management with LDL <55.  We discussed alternative therapy such as PCSK9 inhibitors and siRNA.  DC atorvastatin Start Zetia 10 mg daily Lipids, LFTs in 3 months If LDL above target, refer to lipid clinic for PCSK9 inhibitor  Hypertension Blood pressure well controlled.  Continue amlodipine 10 mg daily, metoprolol tartrate 50 mg twice daily, olmesartan 40 mg daily.  PAT (paroxysmal atrial tachycardia) (HCC) Overall well controlled.  Continue metoprolol tartrate 50 mg twice daily.           Dispo:  Return in about 6 months (around 07/03/2022) for Routine Follow Up w/ Dr. Mayford Knife, or Tereso Newcomer, PA-C.   Medication Adjustments/Labs and Tests Ordered: Current medicines are reviewed at length with the patient today.  Concerns regarding medicines are outlined above.  Tests Ordered: Orders Placed This Encounter  Procedures   Lipid panel   Hepatic function panel   Medication Changes: Meds ordered this encounter  Medications   clopidogrel (PLAVIX) 75 MG tablet    Sig: Take 1 tablet (75 mg total) by mouth daily.    Dispense:  90 tablet    Refill:  3   ezetimibe (ZETIA) 10 MG tablet    Sig: Take 1 tablet (10 mg  total) by mouth daily.    Dispense:  90 tablet    Refill:  3   amLODipine (NORVASC) 10 MG tablet    Sig: Take 1 tablet (10 mg total) by mouth daily.    Dispense:  90 tablet    Refill:  3   metoprolol tartrate (LOPRESSOR) 50 MG tablet    Sig: Take 1 tablet (50 mg total) by mouth 2 (two) times daily.    Dispense:  180 tablet    Refill:  3   olmesartan (BENICAR) 40 MG tablet    Sig: Take 1 tablet (40 mg total) by mouth daily.    Dispense:  90 tablet    Refill:  3   nitroGLYCERIN (NITROSTAT) 0.4 MG SL tablet    Sig: Place 1 tablet (0.4 mg total) under the tongue every 5 (five) minutes x 3 doses as needed for chest pain.    Dispense:  30 tablet    Refill:  3   Signed, Tereso Newcomer, PA-C  01/01/2022 4:36 PM    [facility] [address] [place], [place], [place]  [address] Phone: [phone]; Fax: [phone]

## 2022-01-01 NOTE — Assessment & Plan Note (Addendum)
Status post DES to the LAD in 2021.  He has been on aspirin and Effient since that time.  Since he is greater than 12 months out from his PCI, we can reduce his regimen to a single antiplatelet agent.  His stent length is 32 mm.  Therefore, I think we should keep him on P2 Y12 inhibitor.  He is currently doing well without angina. DC aspirin, Effient Start Plavix 75 mg daily Continue metoprolol 50 mg twice daily, as needed nitroglycerin Follow-up in 6 months

## 2022-01-01 NOTE — Assessment & Plan Note (Signed)
Blood pressure well controlled.  Continue amlodipine 10 mg daily, metoprolol tartrate 50 mg twice daily, olmesartan 40 mg daily.

## 2022-03-04 ENCOUNTER — Other Ambulatory Visit: Payer: Self-pay | Admitting: Cardiology

## 2022-04-03 ENCOUNTER — Telehealth: Payer: Self-pay | Admitting: Cardiology

## 2022-04-03 ENCOUNTER — Ambulatory Visit: Payer: 59 | Attending: Physician Assistant

## 2022-04-03 DIAGNOSIS — I2510 Atherosclerotic heart disease of native coronary artery without angina pectoris: Secondary | ICD-10-CM

## 2022-04-03 LAB — HEPATIC FUNCTION PANEL
ALT: 30 IU/L (ref 0–44)
AST: 29 IU/L (ref 0–40)
Albumin: 4.5 g/dL (ref 3.9–4.9)
Alkaline Phosphatase: 81 IU/L (ref 44–121)
Bilirubin Total: 0.5 mg/dL (ref 0.0–1.2)
Bilirubin, Direct: 0.16 mg/dL (ref 0.00–0.40)
Total Protein: 6 g/dL (ref 6.0–8.5)

## 2022-04-03 LAB — LIPID PANEL
Chol/HDL Ratio: 3.3 ratio (ref 0.0–5.0)
Cholesterol, Total: 153 mg/dL (ref 100–199)
HDL: 46 mg/dL (ref >39)
LDL Chol Calc (NIH): 95 mg/dL (ref 0–99)
Triglycerides: 59 mg/dL (ref 0–149)
VLDL Cholesterol Cal: 12 mg/dL (ref 5–40)

## 2022-04-03 NOTE — Telephone Encounter (Signed)
Pt was in the office this morning for his fasting lipids that Richardson Dopp PA-C ordered on him at last [facility] in Oct 2023.  Pt states this lipid panel was ordered to follow-up on new start zetia that Scott ordered on him at last OV in Oct.   Pt states he only took zetia for about 3 weeks then he had to stop this due to side effects of muscle and joint aches (pt did not call and inform office of this).  He said once he stopped the zetia, his symptoms completely resolved.   Pt is currently on nothing for his lipids.    Pt states he would prefer to manage his cholesterol with lifestyle modification and red yeast rice if possible, unless his lipids from today come back "off the charts."  Pt would like for me to relay this message to Dr. Radford Pax and Richardson Dopp PA-C.   He states if his labs come back terrible and they advise for him to start a new cholesterol regimen, they will have to advise on something other than a statin or zetia, due to intolerance issues.   Pt aware that I will forward this message to both Dr. Radford Pax and Richardson Dopp PA-C to further review and advise on, once his lipids come back today.  He is aware that their covering will touch base with him accordingly thereafter.  Pt verbalized understanding and agrees with this plan.

## 2022-04-03 NOTE — Telephone Encounter (Signed)
Agree. Refer to Lipid Clinic to discuss alternative Rx. Richardson Dopp, PA-C    04/03/2022 12:54 PM

## 2022-04-03 NOTE — Telephone Encounter (Signed)
Please call patient regarding the symptoms he's having with  his choloesterol medication.  He's since d/c the medication. Wondering if there is something else he may be able to tolerate better.

## 2022-04-04 ENCOUNTER — Telehealth: Payer: Self-pay | Admitting: *Deleted

## 2022-04-04 DIAGNOSIS — Z79899 Other long term (current) drug therapy: Secondary | ICD-10-CM

## 2022-04-04 DIAGNOSIS — E785 Hyperlipidemia, unspecified: Secondary | ICD-10-CM

## 2022-04-04 DIAGNOSIS — I2510 Atherosclerotic heart disease of native coronary artery without angina pectoris: Secondary | ICD-10-CM

## 2022-04-04 NOTE — Telephone Encounter (Signed)
See above phone note. Referral has been placed.

## 2022-04-04 NOTE — Telephone Encounter (Signed)
The patient has been notified of the result and verbalized understanding.  All questions (if any) were answered.  Pt aware I will place the referral to our lipid clinic in the system and send a message to our [facility] Scheduling team to call him back and arrange for this appt.   Pt verbalized understanding and agrees with this plan.

## 2022-04-04 NOTE — Telephone Encounter (Signed)
-----  Message from Liliane Shi, [place] sent at 04/04/2022 12:17 PM EST ----- LDL above goal. LDL should be at least < 70 (ideally < 55). LFTs normal. See phone note from 04/03/22. Dr. Radford Pax has requested referral to the [facility].  PLAN:  -Refer to [facility] for PCSK9 inhibitor Rx Richardson Dopp, PA-C    04/04/2022 12:16 PM

## 2022-04-09 ENCOUNTER — Ambulatory Visit: Payer: 59 | Attending: Cardiology | Admitting: Pharmacist

## 2022-04-09 DIAGNOSIS — E782 Mixed hyperlipidemia: Secondary | ICD-10-CM

## 2022-04-09 MED ORDER — ROSUVASTATIN CALCIUM 10 MG PO TABS: 10.0000 mg | ORAL_TABLET | Freq: Every day | ORAL | 3 refills | Status: DC

## 2022-04-09 NOTE — Progress Notes (Signed)
Patient ID: [name]                 DOB: [birth year]                    MRN: [id number]     HPI: Gabriel Jackson is a 62 y.o. male patient of Dr Radford Pax referred to lipid clinic by Richardson Dopp, PA. PMH is significant for CAD with unstable angina 05/2019 s/p DES to LAD, HLD, and HTN. Last seen by Nicki Reaper 12/2021 and reported myalgias on atorvastatin. This was stopped and replaced with ezetimibe. Pt stopped ezetimibe after 3 weeks due to muscle and joint pain. Preferred to manage cholesterol with lifestyle and RYR.  Pt presents today for follow up. Feeling a lot better after stopping his Zetia. Still taking red yeast rice, tolerating well. Had some aches on atorvastatin (rx in 2021 after his stent), Zetia was much worse for him with muscle aches. Concerned about side effects he's heard about with statins like memory issues. Works 12 hr shifts, 7 on 7 off. Stopped eating fast food after his stent was placed, brings food to work with him now. Walks a lot.  Current Medications: none Intolerances: atorvastatin 40-80mg  daily - myalgias; ezetimibe - muscle and joint pain Risk Factors: premature CAD LDL goal: 55mg /dL  Family History: Father with DM  Social History: No tobacco, alcohol or drug use.  Labs: 04/03/22: TC 153, TG 59, HDL 46, LDL 95 (red yeast rice)  Past Medical History:  Diagnosis Date   Anxiety    CAD (coronary artery disease)    Cath 06/06/19 (admx w [place]) >> mLAD 95, 60, 30; pRCA 30, mRCA 30 >> PCI:  DES to mLAD, D2 jailed by stent // Echo 05/2019: EF 60-65, mild asymmetric LVH, trivial MR, normal RVSf    GERD (gastroesophageal reflux disease)    HTN (hypertension)    Hyperlipidemia    OA (osteoarthritis)    PAT (paroxysmal atrial tachycardia)    noted on event monitor 03/2020    Current Outpatient Medications on File Prior to Visit  Medication Sig Dispense Refill   amLODipine (NORVASC) 10 MG tablet Take 1 tablet (10 mg total) by mouth daily. 90 tablet 3   Cholecalciferol  (VITAMIN D3 PO) Take 1 capsule by mouth as directed.     clopidogrel (PLAVIX) 75 MG tablet Take 1 tablet (75 mg total) by mouth daily. 90 tablet 3   Cyanocobalamin (VITAMIN B-12 PO) Take 1 tablet by mouth as directed.     ezetimibe (ZETIA) 10 MG tablet Take 1 tablet (10 mg total) by mouth daily. 90 tablet 3   metoprolol tartrate (LOPRESSOR) 50 MG tablet Take 1 tablet (50 mg total) by mouth 2 (two) times daily. 180 tablet 3   Multiple Vitamin (MULTIVITAMIN) capsule Take 1 capsule by mouth daily.     nitroGLYCERIN (NITROSTAT) 0.4 MG SL tablet Place 1 tablet (0.4 mg total) under the tongue every 5 (five) minutes x 3 doses as needed for chest pain. 30 tablet 3   olmesartan (BENICAR) 40 MG tablet Take 1 tablet (40 mg total) by mouth daily. 90 tablet 3   pantoprazole (PROTONIX) 40 MG tablet Take 40 mg by mouth every morning.     Turmeric (QC TUMERIC COMPLEX) 500 MG CAPS Take 500 mg by mouth in the morning and at bedtime.     zinc gluconate 50 MG tablet Take 50 mg by mouth daily.     No current facility-administered medications on file  prior to visit.    Allergies  Allergen Reactions   Proair Hfa [Albuterol] Hives   Zithromax [Azithromycin] Hives   Other Other (See Comments)    Assessment/Plan:  1. Hyperlipidemia - LDL 95 above goal < 55 given premature CAD. Pt intolerant to atorvastatin 40-80mg  daily and ezetimibe 10mg  daily. Will start lower dose of rosuvastatin 10mg  daily, discussed better tolerability and lower chance of cognitive side effects with hydrophilic statin. Will recheck lipids and LFTs in 6 weeks. He'd like to continue his red yeast rice until his labs come back. Pt will call with any tolerability issues.  Skyelynn Rambeau E. Markella Dao, PharmD, BCACP, [place]. [address], [place], [place] [address] Phone: [phone]; Fax: [phone] 04/09/2022 1:46 PM

## 2022-04-09 NOTE — Patient Instructions (Addendum)
Your LDL cholesterol is 95 and your goal is < 55  Start taking rosuvastatin (Crestor) 10mg  - 1 tablet daily  Recheck fasting labs on Monday, March 18th any time after 7:30am  Call clinic with any tolerability issues

## 2022-05-26 ENCOUNTER — Ambulatory Visit: Payer: 59 | Attending: Cardiology

## 2022-05-26 DIAGNOSIS — E782 Mixed hyperlipidemia: Secondary | ICD-10-CM

## 2022-05-27 ENCOUNTER — Telehealth: Payer: Self-pay

## 2022-05-27 LAB — LIPID PANEL
Chol/HDL Ratio: 2.8 ratio (ref 0.0–5.0)
Cholesterol, Total: 114 mg/dL (ref 100–199)
HDL: 41 mg/dL (ref >39)
LDL Chol Calc (NIH): 61 mg/dL (ref 0–99)
Triglycerides: 53 mg/dL (ref 0–149)
VLDL Cholesterol Cal: 12 mg/dL (ref 5–40)

## 2022-05-27 LAB — ALT: ALT: 34 IU/L (ref 0–44)

## 2022-05-27 NOTE — Telephone Encounter (Signed)
Normal lipid levels reviewed with patient, advised him to continue current medication regimen, patient verbalizes understanding.

## 2022-05-27 NOTE — Telephone Encounter (Signed)
-----   Message from Sueanne Margarita, MD sent at 05/27/2022  6:52 AM EDT ----- Lipids at goal continue current therapy and forward to PCP

## 2022-10-31 ENCOUNTER — Telehealth: Payer: Self-pay

## 2022-10-31 NOTE — Telephone Encounter (Signed)
.  Good Morning Dr. Mayford Knife  We have received a surgical clearance request for a colonoscopy for Mr. Arrona.  He has a PMH of CAD with DES to LAD jailed to D2 branch, HLD, and HTN.  Patient's stent length is 32 mm.  Can you please comment on  guidance for holding Plavix prior to scheduled procedure. Please forward you guidance and recommendations to P CV DIV PREOP.  Thank you Robin Searing, NP

## 2022-10-31 NOTE — Telephone Encounter (Signed)
   Pre-operative Risk Assessment    Patient Name: Gabriel Jackson  DOB: [birth year] MRN: [id number]   Last OV: 01/01/22 with Tereso Newcomer, PA-C Next OV: 01/14/23 with Dr. Mayford Knife   Request for Surgical Clearance    Procedure:   Colonoscopy  Date of Surgery:  Clearance 12/18/22                                 Surgeon:  Dr. Bosie Clos Surgeon's Group or Practice Name:  [facility] Phone number:  [phone] Fax number:  [phone]   Type of Clearance Requested:   - Medical  - Pharmacy:  Hold Clopidogrel (Plavix) pt will need instructions on when/if to hold   Type of Anesthesia:   Propofol   Additional requests/questions:    Wynetta Fines   10/31/2022, 9:40 AM

## 2022-11-03 ENCOUNTER — Telehealth: Payer: Self-pay | Admitting: *Deleted

## 2022-11-03 NOTE — Telephone Encounter (Signed)
   Name: Gabriel Jackson  DOB: [birth year]  MRN: [id number]  Primary Cardiologist: Armanda Magic, MD   Preoperative team, please contact this patient and set up a phone call appointment for further preoperative risk assessment. Please obtain consent and complete medication review. Thank you for your help.  I confirm that guidance regarding antiplatelet and oral anticoagulation therapy has been completed and, if necessary, noted below.  Per Dr. Mayford Knife, he may hold Plavix for 5 days prior to procedure and should resume as soon as hemodynamically stable postoperatively.    Carlos Levering, NP 11/03/2022, 10:14 AM [facility]

## 2022-11-03 NOTE — Telephone Encounter (Signed)
S/w the pt and he has been scheduled for tele pre op appt 11/25/22 @ 10 am. Med rec and consent are done. During med rec pt states he is not taking Crestor as he states causing muscle aches. States he is taking an OTC cholesterol medication but could not think of the name of it. I did advise the pt should let Dr. Mayford Knife know as well to decide if needing to change cholesterol medication. Pt agreed.     Patient Consent for Virtual Visit        Gabriel Jackson has provided verbal consent on 11/03/2022 for a virtual visit (video or telephone).   CONSENT FOR VIRTUAL VISIT FOR:  Gabriel Jackson  By participating in this virtual visit I agree to the following:  I hereby voluntarily request, consent and authorize [facility] and its employed or contracted physicians, physician assistants, nurse practitioners or other licensed health care professionals (the Practitioner), to provide me with telemedicine health care services (the "Services") as deemed necessary by the treating Practitioner. I acknowledge and consent to receive the Services by the Practitioner via telemedicine. I understand that the telemedicine visit will involve communicating with the Practitioner through live audiovisual communication technology and the disclosure of certain medical information by electronic transmission. I acknowledge that I have been given the opportunity to request an in-person assessment or other available alternative prior to the telemedicine visit and am voluntarily participating in the telemedicine visit.  I understand that I have the right to withhold or withdraw my consent to the use of telemedicine in the course of my care at any time, without affecting my right to future care or treatment, and that the Practitioner or I may terminate the telemedicine visit at any time. I understand that I have the right to inspect all information obtained and/or recorded in the course of the telemedicine visit and may  receive copies of available information for a reasonable fee.  I understand that some of the potential risks of receiving the Services via telemedicine include:  Delay or interruption in medical evaluation due to technological equipment failure or disruption; Information transmitted may not be sufficient (e.g. poor resolution of images) to allow for appropriate medical decision making by the Practitioner; and/or  In rare instances, security protocols could fail, causing a breach of personal health information.  Furthermore, I acknowledge that it is my responsibility to provide information about my medical history, conditions and care that is complete and accurate to the best of my ability. I acknowledge that Practitioner's advice, recommendations, and/or decision may be based on factors not within their control, such as incomplete or inaccurate data provided by me or distortions of diagnostic images or specimens that may result from electronic transmissions. I understand that the practice of medicine is not an exact science and that Practitioner makes no warranties or guarantees regarding treatment outcomes. I acknowledge that a copy of this consent can be made available to me via my patient portal [facility] MyChart), or I can request a printed copy by calling the office of [facility].    I understand that my insurance will be billed for this visit.   I have read or had this consent read to me. I understand the contents of this consent, which adequately explains the benefits and risks of the Services being provided via telemedicine.  I have been provided ample opportunity to ask questions regarding this consent and the Services and have had my questions answered to my  satisfaction. I give my informed consent for the services to be provided through the use of telemedicine in my medical care

## 2022-11-03 NOTE — Telephone Encounter (Signed)
S/w the pt and he has been scheduled for tele pre op appt 11/25/22 @ 10 am. Med rec and consent are done. During med rec pt states he is not taking Crestor as he states causing muscle aches. States he is taking an OTC cholesterol medication but could not think of the name of it. I did advise the pt should let Dr. Mayford Knife know as well to decide if needing to change cholesterol medication. Pt agreed.

## 2022-11-25 ENCOUNTER — Ambulatory Visit: Payer: 59 | Attending: Physician Assistant

## 2022-11-25 DIAGNOSIS — Z01810 Encounter for preprocedural cardiovascular examination: Secondary | ICD-10-CM

## 2022-11-25 NOTE — Progress Notes (Signed)
Virtual Visit via Telephone Note   Because of [name]'s co-morbid illnesses, he is at least at moderate risk for complications without adequate follow up.  This format is felt to be most appropriate for this patient at this time.  The patient did not have access to video technology/had technical difficulties with video requiring transitioning to audio format only (telephone).  All issues noted in this document were discussed and addressed.  No physical exam could be performed with this format.  Please refer to the patient's chart for his consent to telehealth for [facility].  Evaluation Performed:  Preoperative cardiovascular risk assessment _____________   Date:  11/25/2022   Patient ID:  Gabriel Jackson, DOB [birth year], MRN [id number] Patient Location:  Home Provider location:   Office  Primary Care Provider:  Daisy Floro, MD Primary Cardiologist:  Armanda Magic, MD  Chief Complaint / Patient Profile   62 y.o. y/o male with a h/o CAD s/p PCI to LAD, HTN, GERD who is pending colonoscopy on 12/18/22 and presents today for telephonic preoperative cardiovascular risk assessment.  History of Present Illness    Gabriel Jackson is a 62 y.o. male who presents via audio/video conferencing for a telehealth visit today.  Pt was last seen in cardiology clinic on 01/02/23 by Tereso Newcomer, PA.  At that time Gabriel Jackson was doing well. The patient is now pending procedure as outlined above. Since his last visit, he has remained stable from a cardiac perspective. He does endorse some nasal congestion that he is seeing ENT for. He takes care of his neighbor's and his own yard. He is able to achieve greater than 4 METs of activity.   Today he denies chest pain, shortness of breath, lower extremity edema, fatigue, palpitations, melena, hematuria, hemoptysis, diaphoresis, weakness, presyncope, syncope, orthopnea, and PND.   Past Medical History    Past Medical History:   Diagnosis Date   Anxiety    CAD (coronary artery disease)    Cath 06/06/19 (admx w [place]) >> mLAD 95, 60, 30; pRCA 30, mRCA 30 >> PCI:  DES to mLAD, D2 jailed by stent // Echo 05/2019: EF 60-65, mild asymmetric LVH, trivial MR, normal RVSf    GERD (gastroesophageal reflux disease)    HTN (hypertension)    Hyperlipidemia    OA (osteoarthritis)    PAT (paroxysmal atrial tachycardia)    noted on event monitor 03/2020   Past Surgical History:  Procedure Laterality Date   CARDIAC CATHETERIZATION     COLONOSCOPY     CORONARY STENT INTERVENTION N/A 06/06/2019   Procedure: CORONARY STENT INTERVENTION;  Surgeon: Yvonne Kendall, MD;  Location: [facility] INVASIVE CV LAB;  Service: Cardiovascular;  Laterality: N/A;   LEFT HEART CATH AND CORONARY ANGIOGRAPHY N/A 06/06/2019   Procedure: LEFT HEART CATH AND CORONARY ANGIOGRAPHY;  Surgeon: Yvonne Kendall, MD;  Location: [facility] INVASIVE CV LAB;  Service: Cardiovascular;  Laterality: N/A;   VASECTOMY      Allergies  Allergies  Allergen Reactions   Proair Hfa [Albuterol] Hives   Zithromax [Azithromycin] Hives   Other Other (See Comments)   Zetia [Ezetimibe]     myalgias    Home Medications    Prior to Admission medications   Medication Sig Start Date End Date Taking? Authorizing Provider  amLODipine (NORVASC) 10 MG tablet Take 1 tablet (10 mg total) by mouth daily. 01/01/22   Tereso Newcomer T, PA-C  Cholecalciferol (VITAMIN D3 PO) Take 1 capsule by mouth as directed.  [provider]  clopidogrel (PLAVIX) 75 MG tablet Take 1 tablet (75 mg total) by mouth daily. 01/01/22   Tereso Newcomer T, PA-C  Cyanocobalamin (VITAMIN B-12 PO) Take 1 tablet by mouth as directed.    [provider]  metoprolol tartrate (LOPRESSOR) 50 MG tablet Take 1 tablet (50 mg total) by mouth 2 (two) times daily. 01/01/22   Tereso Newcomer T, PA-C  Multiple Vitamin (MULTIVITAMIN) capsule Take 1 capsule by mouth daily.    [provider]  nitroGLYCERIN  (NITROSTAT) 0.4 MG SL tablet Place 1 tablet (0.4 mg total) under the tongue every 5 (five) minutes x 3 doses as needed for chest pain. 01/01/22   Tereso Newcomer T, PA-C  olmesartan (BENICAR) 40 MG tablet Take 1 tablet (40 mg total) by mouth daily. 01/01/22   Tereso Newcomer T, PA-C  pantoprazole (PROTONIX) 40 MG tablet Take 40 mg by mouth every morning.    [provider]  rosuvastatin (CRESTOR) 10 MG tablet Take 1 tablet (10 mg total) by mouth daily. Patient not taking: Reported on 11/03/2022 04/09/22   Quintella Reichert, MD  Turmeric (QC TUMERIC COMPLEX) 500 MG CAPS Take 500 mg by mouth in the morning and at bedtime.    [provider]  zinc gluconate 50 MG tablet Take 50 mg by mouth daily.    [provider]    Physical Exam    Vital Signs:  Gabriel Jackson does not have vital signs available for review today.  Given telephonic nature of communication, physical exam is limited. AAOx3. NAD. Normal affect.  Speech and respirations are unlabored.  Accessory Clinical Findings    None  Assessment & Plan    1.  Preoperative Cardiovascular Risk Assessment: Colonoscopy  :[id number]} Gabriel Jackson perioperative risk of a major cardiac event is 0.9% according to the Revised Cardiac Risk Index (RCRI).  Therefore, he is at low risk for perioperative complications.   His functional capacity is excellent at 8.97 METs according to the Duke Activity Status Index (DASI). Recommendations: According to ACC/AHA guidelines, no further cardiovascular testing needed.  The patient may proceed to surgery at acceptable risk.   Antiplatelet and/or Anticoagulation Recommendations: Per Dr. Mayford Knife, he may hold Plavix for 5 days prior to procedure and should resume as soon as hemodynamically stable postoperatively. Would recommend starting aspirin 81 mg daily in place of Plavix during the perioperative period, then discontinuing when resuming Plavix. If biopsies are planned or expected aspirin  does not need to be initiated.    The patient was advised that if he develops new symptoms prior to surgery to contact our office to arrange for a follow-up visit, and he verbalized understanding.  A copy of this note will be routed to requesting surgeon.  Time:   Today, I have spent 17 minutes with the patient with telehealth technology discussing medical history, symptoms, and management plan.    Rip Harbour, NP  11/25/2022, 10:19 AM

## 2023-01-09 ENCOUNTER — Other Ambulatory Visit: Payer: Self-pay | Admitting: Physician Assistant

## 2023-01-11 ENCOUNTER — Other Ambulatory Visit: Payer: Self-pay | Admitting: Physician Assistant

## 2023-01-14 ENCOUNTER — Encounter: Payer: Self-pay | Admitting: Cardiology

## 2023-01-14 ENCOUNTER — Ambulatory Visit: Payer: 59 | Attending: Cardiology | Admitting: Cardiology

## 2023-01-14 ENCOUNTER — Other Ambulatory Visit: Payer: Self-pay

## 2023-01-14 VITALS — BP 122/76 | HR 58 | Ht 71.0 in | Wt 225.4 lb

## 2023-01-14 DIAGNOSIS — I2510 Atherosclerotic heart disease of native coronary artery without angina pectoris: Secondary | ICD-10-CM

## 2023-01-14 DIAGNOSIS — Z79899 Other long term (current) drug therapy: Secondary | ICD-10-CM | POA: Diagnosis not present

## 2023-01-14 DIAGNOSIS — Z01812 Encounter for preprocedural laboratory examination: Secondary | ICD-10-CM

## 2023-01-14 DIAGNOSIS — R0689 Other abnormalities of breathing: Secondary | ICD-10-CM

## 2023-01-14 DIAGNOSIS — I259 Chronic ischemic heart disease, unspecified: Secondary | ICD-10-CM

## 2023-01-14 DIAGNOSIS — I4719 Other supraventricular tachycardia: Secondary | ICD-10-CM | POA: Diagnosis not present

## 2023-01-14 MED ORDER — METOPROLOL TARTRATE 25 MG PO TABS: 25.0000 mg | ORAL_TABLET | Freq: Once | ORAL | 0 refills | Status: AC

## 2023-01-14 NOTE — Progress Notes (Signed)
Cardiology Office Note:    Date:  01/14/2023   ID:  Gabriel Jackson, DOB [birth year], MRN [id number]  PCP:  Daisy Floro, MD  [facility] Providers Cardiologist:  Armanda Magic, MD    Referring MD: Daisy Floro, MD   Chief Complaint:  Coronary Artery Disease, Hypertension, and Hyperlipidemia    Patient Profile: Coronary artery disease  [place] 05/2019:  S/p DES to LAD (jailed D2 branch) Myoview 1/22: low risk  Echocardiogram 3/21: EF 60-65 Paroxysmal atrial tachycardia  Monitor 2/22: Longest episode 15 beats GERD Hypertension  Hyperlipidemia  Anxiety   Prior CV Studies: LONG TERM MONITOR (8-14 DAYS) INTERPRETATION 04/10/2020  Predominant rhythm is normal sinus rhythm with average heart rate 72bpm and ranged fro 47-168bpm.  Nonsustained atrial tachycardia with shortest episode lasting 5 beats at 182bpm and longest episode lasting 15 beats.  Ventricular couplet  GATED SPECT MYO PERF W/LEXISCAN STRESS 1D 03/22/2020  The left ventricular ejection fraction is mildly decreased (45-54%).  Nuclear stress EF: 53%.  There was no ST segment deviation noted during stress.  The study is normal.  This is a low risk study. Normal resting and stress perfusion. No ischemia or infarction EF 53%   NON-TELEMETRY MONITORING-INTERPRETATION ONLY 06/23/2019  Sinus bradycardia, normal sinus rhythm and sinus tachycardia. The average heart rate was 83bpm and ranged from 54-160bpm.  Occasional PACs     Cardiac catheterization 06/06/19 LAD mid 95, 60, 30 RCA prox 30, mid 30 PCI:  3 x 32 mm Synergy DES to mLAD, jailed D2 branch   Echocardiogram 05/16/19 EF 60-65, no RWMA, mild asymmetric LVH, normal RVSF, normal RVSP, trivial MR  History of Present Illness:   Gabriel Jackson is a 62 y.o. male with the above problem list.  He is here today for followup and is doing well.  He tells me that he has been having some SOB recently that he thinks may be due to nasal congestion.   He went to the ENT and was dx with a deviated nasal septum.  He was instructed to do nasal saline spray and flonase at home.  He thinks that that has helped but still when walking at night he feels SOB.  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  He denies any chest pain or pressure, PND, orthopnea, LE edema, dizziness, palpitations or syncope. He is compliant with his meds and is tolerating meds with no SE.      Past Medical History:  Diagnosis Date   Anxiety    CAD (coronary artery disease)    Cath 06/06/19 (admx w [place]) >> mLAD 95, 60, 30; pRCA 30, mRCA 30 >> PCI:  DES to mLAD, D2 jailed by stent // Echo 05/2019: EF 60-65, mild asymmetric LVH, trivial MR, normal RVSf    GERD (gastroesophageal reflux disease)    HTN (hypertension)    Hyperlipidemia    OA (osteoarthritis)    PAT (paroxysmal atrial tachycardia)    noted on event monitor 03/2020   Current Medications: Current Meds  Medication Sig   albuterol (VENTOLIN HFA) 108 (90 Base) MCG/ACT inhaler 2 puff as needed Inhalation every 4 hrs   amLODipine (NORVASC) 5 MG tablet Take 5 mg by mouth daily.   fluticasone (FLONASE) 50 MCG/ACT nasal spray 1 spray in each nostril Nasally Once a day    Allergies:   Proair hfa [albuterol], Zithromax [azithromycin], Other, and Zetia [ezetimibe]   Social  History   Tobacco Use   Smoking status: Never   Smokeless tobacco: Never  Vaping Use   Vaping status: Never Used  Substance Use Topics   Alcohol use: Yes    Comment: beer on rare occasions   Drug use: No    Family Hx: The patient's family history includes Diabetes in his father; Healthy in his brother.  Review of Systems  Musculoskeletal:  Positive for myalgias.     EKGs/Labs/Other Test Reviewed:    EKG Interpretation Date/Time:  Wednesday January 14 2023 09:52:02 EST Ventricular Rate:  58 PR Interval:  184 QRS Duration:  70 QT Interval:  378 QTC Calculation: 371 R  Axis:   -1  Text Interpretation: Sinus bradycardia When compared with ECG of 07-Jun-2019 03:52, Criteria for Inferior infarct are no longer Present Confirmed by Armanda Magic ([id number]) on 01/14/2023 9:55:47 AM    Recent Labs: 05/26/2022: ALT 34   Recent Lipid Panel Recent Labs    05/26/22 0821  CHOL 114  TRIG 53  HDL 41  LDLCALC 61     Physical Exam:    VS:  There were no vitals taken for this visit.    Wt Readings from Last 3 Encounters:  01/01/22 232 lb 9.6 oz (105.5 kg)  03/20/21 237 lb (107.5 kg)  06/12/20 210 lb (95.3 kg)    GEN: Well nourished, well developed in no acute distress HEENT: Normal NECK: No JVD; No carotid bruits LYMPHATICS: No lymphadenopathy CARDIAC:RRR, no murmurs, rubs, gallops RESPIRATORY:  Clear to auscultation without rales, wheezing or rhonchi  ABDOMEN: Soft, non-tender, non-distended MUSCULOSKELETAL:  No edema; No deformity  SKIN: Warm and dry NEUROLOGIC:  Alert and oriented x 3 PSYCHIATRIC:  Normal affect       ASSESSMENT & PLAN:    Coronary artery disease involving native coronary artery of native heart without angina pectoris Status post DES to the LAD in 2021.  He denies any anginal chest pain but has been having SOB.  The SOB sounds more related to nasal issues but he is concerned that it could be his heart.  He has not had any of his angina he had prior to his PCI. Continue prescription drug management with Lopressor 50 mg twice daily, Crestor 10 mg daily and Plavix 75 mg daily with as needed refills  I will get a coronary CTA to redefine coronary anatomy (he has a 3.94mm stent in the LAD   Hyperlipidemia with target LDL less than 70 He was intolerant to atorvastatin  He was seen in lipid clinic and started on Crestor 10 mg daily and Zetia 10 mg daily and repeat lipids were at goal but stopped taking these in the summer Repeat FLP and CMET Refer to the clinic for possible PCSK9i  Hypertension BP controlled on exam today Continue  prescription drug management amlodipine 5 mg daily, Lopressor 50 mg twice daily and olmesartan 40 mg daily with as needed refills   PAT (paroxysmal atrial tachycardia) (HCC) Denies any palpitations Continue metoprolol 50 mg twice daily with as needed refills  Dispo:  followup with me in 1 year  Medication Adjustments/Labs and Tests Ordered: Current medicines are reviewed at length with the patient today.  Concerns regarding medicines are outlined above.  Tests Ordered: Orders Placed This Encounter  Procedures   EKG 12-Lead   Medication Changes: No orders of the defined types were placed in this encounter.  Signed, Armanda Magic, MD  01/14/2023 9:49 AM    [facility] [address] [place],  [place], [place]  [address] Phone: [phone]; Fax: [phone]

## 2023-01-14 NOTE — Addendum Note (Signed)
Addended by: Luellen Pucker on: 01/14/2023 10:23 AM   Modules accepted: Orders

## 2023-01-14 NOTE — Patient Instructions (Signed)
Medication Instructions:  Your physician recommends that you continue on your current medications as directed. Please refer to the Current Medication list given to you today.  *If you need a refill on your cardiac medications before your next appointment, please call your pharmacy*   Lab Work: Please complete a BMET in our lab before you leave today.  Please make an appointment to complete a FASTING lipid panel and an ALT in our lab when able.  If you have labs (blood work) drawn today and your tests are completely normal, you will receive your results only by: MyChart Message (if you have MyChart) OR A paper copy in the mail If you have any lab test that is abnormal or we need to change your treatment, we will call you to review the results.   Testing/Procedures: Your physician has requested that you have an echocardiogram. Echocardiography is a painless test that uses sound waves to create images of your heart. It provides your doctor with information about the size and shape of your heart and how well your heart's chambers and valves are working. This procedure takes approximately one hour. There are no restrictions for this procedure. Please do NOT wear cologne, perfume, aftershave, or lotions (deodorant is allowed). Please arrive 15 minutes prior to your appointment time.  Please note: We ask at that you not bring children with you during ultrasound (echo/ vascular) testing. Due to room size and safety concerns, children are not allowed in the ultrasound rooms during exams. Our front office staff cannot provide observation of children in our lobby area while testing is being conducted. An adult accompanying a patient to their appointment will only be allowed in the ultrasound room at the discretion of the ultrasound technician under special circumstances. We apologize for any inconvenience.    Your cardiac CT will be scheduled at one of the below locations:   [facility] [address] [place], [place] [address] [phone]  Please arrive at the [facility] and Children's Entrance (Entrance C2) of [facility] 30 minutes prior to test start time. You can use the FREE valet parking offered at entrance C (encouraged to control the heart rate for the test)  Proceed to the [facility] Radiology Department (first floor) to check-in and test prep.  All radiology patients and guests should use entrance C2 at [facility], accessed from [facility], even though the hospital's physical address listed is [address].     Please follow these instructions carefully (unless otherwise directed):  An IV will be required for this test and Nitroglycerin will be given.  Hold all erectile dysfunction medications at least 3 days (72 hrs) prior to test. (Ie viagra, cialis, sildenafil, tadalafil, etc)   On the Night Before the Test: Be sure to Drink plenty of water. Do not consume any caffeinated/decaffeinated beverages or chocolate 12 hours prior to your test. Do not take any antihistamines 12 hours prior to your test.  On the Day of the Test: Drink plenty of water until 1 hour prior to the test. Do not eat any food 1 hour prior to test. You may take your regular medications prior to the test.  Take 25mg  metoprolol (Lopressor) two hours prior to test (one time dose). If you take Furosemide/Hydrochlorothiazide/Spironolactone, please HOLD on the morning of the test.   15 mg PO in combination with metoprolol tartrate for HR >80 bpm        After the Test: Drink plenty  of water. After receiving IV contrast, you may experience a mild flushed feeling. This is normal. On occasion, you may experience a mild rash up to 24 hours after the test. This is not dangerous. If this occurs, you can take Benadryl 25 mg and increase your fluid intake. If you experience trouble breathing, this can be serious. If it is severe call 911  IMMEDIATELY. If it is mild, please call our office. If you take any of these medications: Glipizide/Metformin, Avandament, Glucavance, please do not take 48 hours after completing test unless otherwise instructed.  We will call to schedule your test 2-4 weeks out understanding that some insurance companies will need an authorization prior to the service being performed.   For more information and frequently asked questions, please visit our website : [email or web address]  For non-scheduling related questions, please contact the cardiac imaging nurse navigator should you have any questions/concerns: Cardiac Imaging Nurse Navigators Direct Office Dial: [phone]   For scheduling needs, including cancellations and rescheduling, please call [place], [phone].    Follow-Up: At [facility], you and your health needs are our priority.  As part of our continuing mission to provide you with exceptional heart care, we have created designated Provider Care Teams.  These Care Teams include your primary Cardiologist (physician) and Advanced Practice Providers (APPs -  Physician Assistants and Nurse Practitioners) who all work together to provide you with the care you need, when you need it.  We recommend signing up for the patient portal called "MyChart".  Sign up information is provided on this After Visit Summary.  MyChart is used to connect with patients for Virtual Visits (Telemedicine).  Patients are able to view lab/test results, encounter notes, upcoming appointments, etc.  Non-urgent messages can be sent to your provider as well.   To learn more about what you can do with MyChart, go to [email or web address].    Your next appointment:   1 year(s)  Provider:   Armanda Magic, MD

## 2023-01-15 ENCOUNTER — Telehealth: Payer: Self-pay

## 2023-01-15 LAB — BASIC METABOLIC PANEL
BUN/Creatinine Ratio: 27 — ABNORMAL HIGH (ref 10–24)
BUN: 26 mg/dL (ref 8–27)
CO2: 28 mmol/L (ref 20–29)
Calcium: 9.2 mg/dL (ref 8.6–10.2)
Chloride: 104 mmol/L (ref 96–106)
Creatinine, Ser: 0.98 mg/dL (ref 0.76–1.27)
Glucose: 104 mg/dL — ABNORMAL HIGH (ref 70–99)
Potassium: 4.1 mmol/L (ref 3.5–5.2)
Sodium: 141 mmol/L (ref 134–144)
eGFR: 87 mL/min/{1.73_m2} (ref >59)

## 2023-01-15 NOTE — Telephone Encounter (Signed)
-----   Message from Armanda Magic sent at 01/15/2023  8:12 AM EST ----- Please let patient know that labs were normal.  Continue current medical therapy.

## 2023-01-15 NOTE — Telephone Encounter (Signed)
Call to patient to discuss normal labs, patient verbalizes understanding to continue current meds.

## 2023-01-16 LAB — LIPID PANEL
Chol/HDL Ratio: 4.2 ratio (ref 0.0–5.0)
Cholesterol, Total: 203 mg/dL — ABNORMAL HIGH (ref 100–199)
HDL: 48 mg/dL (ref >39)
LDL Chol Calc (NIH): 141 mg/dL — ABNORMAL HIGH (ref 0–99)
Triglycerides: 80 mg/dL (ref 0–149)
VLDL Cholesterol Cal: 14 mg/dL (ref 5–40)

## 2023-01-16 LAB — COMPREHENSIVE METABOLIC PANEL
ALT: 34 [IU]/L (ref 0–44)
AST: 26 [IU]/L (ref 0–40)
Albumin: 4.4 g/dL (ref 3.9–4.9)
Alkaline Phosphatase: 82 [IU]/L (ref 44–121)
BUN/Creatinine Ratio: 20 (ref 10–24)
BUN: 19 mg/dL (ref 8–27)
Bilirubin Total: 0.9 mg/dL (ref 0.0–1.2)
CO2: 25 mmol/L (ref 20–29)
Calcium: 9.5 mg/dL (ref 8.6–10.2)
Chloride: 105 mmol/L (ref 96–106)
Creatinine, Ser: 0.95 mg/dL (ref 0.76–1.27)
Globulin, Total: 1.8 g/dL (ref 1.5–4.5)
Glucose: 105 mg/dL — ABNORMAL HIGH (ref 70–99)
Potassium: 4.1 mmol/L (ref 3.5–5.2)
Sodium: 143 mmol/L (ref 134–144)
Total Protein: 6.2 g/dL (ref 6.0–8.5)
eGFR: 90 mL/min/{1.73_m2} (ref >59)

## 2023-01-26 ENCOUNTER — Telehealth: Payer: Self-pay

## 2023-01-26 DIAGNOSIS — E782 Mixed hyperlipidemia: Secondary | ICD-10-CM

## 2023-01-26 NOTE — Telephone Encounter (Signed)
Reviewed elevated LDL with patient, who verbalizes understanding that Lipids are still not at goal. Patient accepts referral to lipid clinic, order placed.

## 2023-01-26 NOTE — Telephone Encounter (Signed)
-----   Message from Armanda Magic sent at 01/20/2023  9:50 AM EST ----- Lipids still not at goal.  Please forward to lipid clinic for further recommendations.

## 2023-02-13 ENCOUNTER — Ambulatory Visit (HOSPITAL_COMMUNITY): Payer: 59 | Attending: Cardiology

## 2023-02-13 DIAGNOSIS — R0689 Other abnormalities of breathing: Secondary | ICD-10-CM | POA: Diagnosis not present

## 2023-02-13 DIAGNOSIS — I259 Chronic ischemic heart disease, unspecified: Secondary | ICD-10-CM | POA: Diagnosis present

## 2023-02-13 LAB — ECHOCARDIOGRAM COMPLETE
Area-P 1/2: 3.32 cm2
S' Lateral: 2.1 cm

## 2023-02-18 ENCOUNTER — Telehealth (HOSPITAL_COMMUNITY): Payer: Self-pay | Admitting: *Deleted

## 2023-02-18 NOTE — Telephone Encounter (Signed)
Reaching out to patient to offer assistance regarding upcoming cardiac imaging study; pt verbalizes understanding of appt date/time, parking situation and where to check in, pre-test NPO status and verified current allergies; name and call back number provided for further questions should they arise  Larey Brick RN Navigator Cardiac Imaging [facility] [phone] office [phone] cell  Patient to take his daily metoprolol two hours prior to his cardiac CT scan. He is aware to arrive at 8 AM.

## 2023-02-20 ENCOUNTER — Other Ambulatory Visit (HOSPITAL_COMMUNITY): Payer: Self-pay | Admitting: Emergency Medicine

## 2023-02-20 ENCOUNTER — Ambulatory Visit (HOSPITAL_COMMUNITY)
Admission: RE | Admit: 2023-02-20 | Discharge: 2023-02-20 | Disposition: A | Discharge status: Home / Self Care | Payer: 59 | Source: Ambulatory Visit | Attending: Cardiology | Admitting: Cardiology

## 2023-02-20 DIAGNOSIS — I700 Atherosclerosis of aorta: Secondary | ICD-10-CM | POA: Diagnosis not present

## 2023-02-20 DIAGNOSIS — R0689 Other abnormalities of breathing: Secondary | ICD-10-CM | POA: Insufficient documentation

## 2023-02-20 MED ORDER — NITROGLYCERIN 0.4 MG SL SUBL
0.8000 mg | SUBLINGUAL_TABLET | Freq: Once | SUBLINGUAL | Status: AC
  Administered 2023-02-20: 0.8 mg via SUBLINGUAL

## 2023-02-20 MED ORDER — IOHEXOL 350 MG/ML SOLN
100.0000 mL | Freq: Once | INTRAVENOUS | Status: AC | PRN
  Administered 2023-02-20: 100 mL via INTRAVENOUS

## 2023-02-20 MED ORDER — NITROGLYCERIN 0.4 MG SL SUBL
SUBLINGUAL_TABLET | SUBLINGUAL | Status: AC
  Filled 2023-02-20: qty 2

## 2023-02-24 ENCOUNTER — Encounter: Payer: Self-pay | Admitting: Cardiology

## 2023-02-24 DIAGNOSIS — I77810 Thoracic aortic ectasia: Secondary | ICD-10-CM | POA: Insufficient documentation

## 2023-02-26 ENCOUNTER — Telehealth: Payer: Self-pay

## 2023-02-26 NOTE — Telephone Encounter (Signed)
No updated DPR on file, called to discuss CTA and echo results, no answer. Left message with no identifiers asking recipient to call [place] at our office #.

## 2023-02-26 NOTE — Telephone Encounter (Signed)
-----   Message from Armanda Magic sent at 02/24/2023 10:44 AM EST ----- Coronary CTA showed a coronary calcium score of 154 with 25 to 49% ostial RCA, less than 25% mid to distal RCA, less than 25% proximal LAD prior to the stented segment in the mid LAD.  The stented segment appears patent with no significant in-stent restenosis.  Over all nonobstructive CAD with patent LAD stent.  Continue current medical therapy

## 2023-02-27 ENCOUNTER — Telehealth: Payer: Self-pay | Admitting: Cardiology

## 2023-02-27 DIAGNOSIS — I77810 Thoracic aortic ectasia: Secondary | ICD-10-CM

## 2023-02-27 NOTE — Telephone Encounter (Signed)
Pt returning call to nurse

## 2023-02-27 NOTE — Telephone Encounter (Signed)
Returned patient's call to discuss echo and coronary CTA results. 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  Repeat limited 2D echo in 1 year for dilated ascending aorta  Patient verbalizes understanding and agrees to plan, order placed.   Also discussed Coronary CTA showed a coronary calcium score of 154 with 25 to 49% ostial RCA, less than 25% mid to distal RCA, less than 25% proximal LAD prior to the stented segment in the mid LAD.  The stented segment appears patent with no significant in-stent restenosis.  Over all nonobstructive CAD with patent LAD stent.  Patient agrees to continue current medical therapy.

## 2023-03-10 ENCOUNTER — Telehealth: Payer: Self-pay | Admitting: Pharmacist

## 2023-03-10 ENCOUNTER — Other Ambulatory Visit (HOSPITAL_COMMUNITY): Payer: Self-pay

## 2023-03-10 ENCOUNTER — Ambulatory Visit: Payer: 59

## 2023-03-10 ENCOUNTER — Telehealth: Payer: Self-pay | Admitting: Pharmacy Technician

## 2023-03-10 ENCOUNTER — Encounter: Payer: Self-pay | Admitting: Student

## 2023-03-10 ENCOUNTER — Ambulatory Visit: Payer: 59 | Attending: Cardiology | Admitting: Student

## 2023-03-10 DIAGNOSIS — E7849 Other hyperlipidemia: Secondary | ICD-10-CM

## 2023-03-10 DIAGNOSIS — E782 Mixed hyperlipidemia: Secondary | ICD-10-CM

## 2023-03-10 MED ORDER — REPATHA SURECLICK 140 MG/ML ~~LOC~~ SOAJ: 140.0000 mg | SUBCUTANEOUS | 3 refills | Status: AC

## 2023-03-10 MED ORDER — EZETIMIBE 10 MG PO TABS: 10.0000 mg | ORAL_TABLET | Freq: Every day | ORAL | 3 refills | Status: DC

## 2023-03-10 NOTE — Telephone Encounter (Signed)
 Pharmacy Patient Advocate Encounter   Received notification from Pt Calls Messages that prior authorization for repatha  is required/requested.   Insurance verification completed.   The patient is insured through  [employer]  .   Per test claim: The current 03/10/23 day co-pay is, $75.00 one month.  No PA needed at this time. This test claim was processed through [facility]- copay amounts may vary at other pharmacies due to pharmacy/plan contracts, or as the patient moves through the different stages of their insurance plan.

## 2023-03-10 NOTE — Telephone Encounter (Signed)
Patient made aware of PA requirement, prescription sent to preferred pharmacy and f/u lab due on March 24.

## 2023-03-10 NOTE — Patient Instructions (Addendum)
 Your Results:             Lab while you were on Crestor  10 mg  Your most recent labs Goal  Total Cholesterol 114 203 < 200  Triglycerides 53 80 < 150  HDL (happy/good cholesterol) 41 48 > 40  LDL (lousy/bad cholesterol 61 141 < 70   Medication changes: Start taking Zetia  10 mg daily and we will start the process to get PCSK9i( Repatha  or Praluent)  covered by your insurance.  Once the prior authorization is complete, we will call you to let you know and confirm pharmacy information.     Praluent is a cholesterol medication that improved your body's ability to get rid of bad cholesterol known as LDL. It can lower your LDL up to 60%. It is an injection that is given under the skin every 2 weeks. The most common side effects of Praluent include runny nose, symptoms of the common cold, rarely flu or flu-like symptoms, back/muscle pain in about 3-4% of the patients, and redness, pain, or bruising at the injection site.    Repatha  is a cholesterol medication that improved your body's ability to get rid of bad cholesterol known as LDL. It can lower your LDL up to 60%! It is an injection that is given under the skin every 2 weeks. The medication often requires a prior authorization from your insurance company. The most common side effects of Repatha  include runny nose, symptoms of the common cold, rarely flu or flu-like symptoms, back/muscle pain in about 3-4% of the patients, and redness, pain, or bruising at the injection site.   Lab orders: We want to repeat labs after 2-3 months.  We will send you a lab order to remind you once we get closer to that time.

## 2023-03-10 NOTE — Addendum Note (Signed)
 Addended by: Tylene Fantasia on: 03/10/2023 12:02 PM   Modules accepted: Orders

## 2023-03-10 NOTE — Telephone Encounter (Signed)
 PCSK9i PA request sent to the pool

## 2023-03-10 NOTE — Assessment & Plan Note (Signed)
 Assessment:  LDL goal: <70 mg/dl last LDLc 858 mg/dl (88/7975) was at goal when he was on Crestor  10 mg but unable to tolerate due to severe muscle cramps  Other intolerances includes atorvastatin  and ezetimibe - severe myalgia and brain fog from statin  Willing to re try ezetimibe  as he tried that when he was on atorvastatin   Diet is not great lately but will be switching to low carb, high protein diet starting from 2025 Discussed next potential options (PCSK-9 inhibitors and inclisiran); cost, dosing efficacy, side effects   Plan: Start taking ezetimibe  10 mg daily  Will apply for PA for PCSK9i; will inform patient upon approval  Lipid lab and LFTs due in 2-3 months of therapy modification

## 2023-03-10 NOTE — Progress Notes (Signed)
 Patient ID: Gabriel Jackson                 DOB: 1960/07/04                    MRN: [id number]      HPI: Gabriel Jackson is a 62 y.o. male patient referred to lipid clinic by Dr.Turner. PMH is significant for coronary calcium  score of 154,CAD with unstable angina 05/2019 s/p DES to LAD, HLD, and HTN. Patient unable to tolerate atorvastatin  and Zetia  due to myalgia, so in the past he preferred to manage cholesterol with lifestyle an red yeast rice. Patient saw Megan Jan 2024, for lipid management he was put on Crestor  10 mg daily with RYR. LDLc went down from 95 to 61 mg/dl which was closer to goal <70 mg/dl. Patient saw Dr.turner on 01/14/2023 reported he has stopped taking Crestor  since summer. Recent Lab LDLc went up 141 mg/dl currently not on any cholesterol medication.  Lab while  on Crestor  10 mg March 2024 Your most recent labs, Nov 2024 Goal  Total Cholesterol 114 203 < 200  Triglycerides 53 80 < 150  HDL  41 48 > 40  LDL  61 141 < 70   Today patient presented for lipid clinic. Reports he could not tolerate Crestor  10 mg due to severe muscle cramps. Him and his wife thinking to go on anti-inflammatory diet starting from new year. He works 12 hours shifts 7 days on 7 day off. His work involve lot os walking nut other than that he does not have any exercise schedule. He is willing to try non statin options. He has tried Zetia  when he was on Lipitor  so open to re-try Zetia .Reviewed options for lowering LDL cholesterol, including  PCSK-9 inhibitors,and inclisiran.  Discussed mechanisms of action, dosing, side effects and potential decreases in LDL cholesterol.  Also reviewed cost information and potential options for patient assistance. Patient prefer self injection over inclisiran.   Current Medications: none  Intolerances: Ezetimibe  10 mg Crestor  10 gm and Atorvastatin  40-20 mg - severe myalgia  Risk Factors: calcium  score of 154,CAD with unstable angina 05/2019 s/p DES to LAD, HLD, and HTN. LDL  goal: <70 mg/dl  Recent lab: LDL: 858, HDL 48, TG 80 and TC 203  LDLc was at goal while on Crestor  LDLc 61, HLD 41, TG 53 and TC 114  Diet: will be following  low carb, high protein diet from 2025    Exercise: work involve lots of walking, no other exercise regimen   Family History:  Relation Problem Comments  Mother  Pancreatic cancer  Father (Alive) Diabetes, got stent 3 years ago, arrhythmia      Brother Metallurgist) Obesity, back problem     Social History:  Alcohol: beer 1-2 week  Smoking: never  Recreational drug: never   Labs: Lipid Panel     Component Value Date/Time   CHOL 203 (H) 01/15/2023 0912   TRIG 80 01/15/2023 0912   HDL 48 01/15/2023 0912   CHOLHDL 4.2 01/15/2023 0912   CHOLHDL 4.8 06/03/2019 1952   VLDL 13 06/03/2019 1952   LDLCALC 141 (H) 01/15/2023 0912   LABVLDL 14 01/15/2023 0912    Past Medical History:  Diagnosis Date   Anxiety    Ascending aorta dilatation (HCC)    Borderline at 38 mm by 2D echo 02/2023   CAD (coronary artery disease)    Cath 06/06/19 (admx w [facility] ) >> mLAD 95, 60, 30; pRCA  30, mRCA 30 >> PCI:  DES to mLAD, D2 jailed by stent; coronary CTA 02/2023 with nonobstructive CAD and patent LAD stent   GERD (gastroesophageal reflux disease)    HTN (hypertension)    Hyperlipidemia    OA (osteoarthritis)    PAT (paroxysmal atrial tachycardia) (HCC)    noted on event monitor 03/2020    Current Outpatient Medications on File Prior to Visit  Medication Sig Dispense Refill   albuterol (VENTOLIN HFA) 108 (90 Base) MCG/ACT inhaler 2 puff as needed Inhalation every 4 hrs     amLODipine  (NORVASC ) 5 MG tablet Take 5 mg by mouth daily.     Cholecalciferol (VITAMIN D3 PO) Take 1 capsule by mouth as directed.     clopidogrel  (PLAVIX ) 75 MG tablet TAKE 1 TABLET BY MOUTH DAILY 15 tablet 0   Cyanocobalamin (VITAMIN B-12 PO) Take 1 tablet by mouth as directed.     fluticasone (FLONASE) 50 MCG/ACT nasal spray 1 spray in each nostril Nasally Once a day      metoprolol  tartrate (LOPRESSOR ) 25 MG tablet Take 1 tablet (25 mg total) by mouth once for 1 dose. Take 90-120 minutes prior to scan. Hold for SBP less than 110. 1 tablet 0   metoprolol  tartrate (LOPRESSOR ) 50 MG tablet TAKE 1 TABLET BY MOUTH TWICE  DAILY 180 tablet 3   Multiple Vitamin (MULTIVITAMIN) capsule Take 1 capsule by mouth daily.     nitroGLYCERIN  (NITROSTAT ) 0.4 MG SL tablet Place 1 tablet (0.4 mg total) under the tongue every 5 (five) minutes x 3 doses as needed for chest pain. 30 tablet 3   olmesartan  (BENICAR ) 40 MG tablet Take 1 tablet (40 mg total) by mouth daily. 90 tablet 3   pantoprazole  (PROTONIX ) 40 MG tablet Take 40 mg by mouth every morning.     Turmeric (QC TUMERIC COMPLEX) 500 MG CAPS Take 500 mg by mouth in the morning and at bedtime.     zinc gluconate 50 MG tablet Take 50 mg by mouth daily.     No current facility-administered medications on file prior to visit.    Allergies  Allergen Reactions   Proair Hfa [Albuterol] Hives   Zithromax [Azithromycin] Hives   Atorvastatin  Other (See Comments)    Myalgia    Crestor  [Rosuvastatin ] Other (See Comments)    Myalgia    Other Other (See Comments)   Zetia  [Ezetimibe ]     myalgias    Assessment/Plan:  1. Hyperlipidemia -  Problem  Hyperlipidemia   Hyperlipidemia Assessment:  LDL goal: <70 mg/dl last LDLc 858 mg/dl (88/7975) was at goal when he was on Crestor  10 mg but unable to tolerate due to severe muscle cramps  Other intolerances includes atorvastatin  and ezetimibe - severe myalgia and brain fog from statin  Willing to re try ezetimibe  as he tried that when he was on atorvastatin   Diet is not great lately but will be switching to low carb, high protein diet starting from 2025 Discussed next potential options (PCSK-9 inhibitors and inclisiran); cost, dosing efficacy, side effects   Plan: Start taking ezetimibe  10 mg daily  Will apply for PA for PCSK9i; will inform patient upon approval  Lipid lab and  LFTs due in 2-3 months of therapy modification     Thank you,  Robbi Blanch, Pharm.D [facility] of [facility] [address] [place], [place] [address]  Phone: [phone]; Fax: [phone]

## 2023-04-01 ENCOUNTER — Telehealth: Payer: Self-pay | Admitting: Cardiology

## 2023-04-01 NOTE — Telephone Encounter (Signed)
Pt c/o medication issue:  1. Name of Medication:  ezetimibe (ZETIA) 10 MG tablet  2. How are you currently taking this medication (dosage and times per day)?   3. Are you having a reaction (difficulty breathing--STAT)?   4. What is your medication issue?   Patient states he has muscle aches and plans on stopping the medication today.

## 2023-04-02 NOTE — Telephone Encounter (Signed)
Patient reports that he is still taking Repatha

## 2023-04-02 NOTE — Telephone Encounter (Signed)
Will remove from med list, please verify patient is taking repatha

## 2023-05-06 ENCOUNTER — Other Ambulatory Visit: Payer: Self-pay | Admitting: Cardiology

## 2023-06-02 ENCOUNTER — Telehealth: Payer: Self-pay | Admitting: Pharmacist

## 2023-06-02 NOTE — Telephone Encounter (Signed)
 Started Repatha early Jan 2025 due for follow up lipid lab.  Call pt to remind, will be going for lab on April 15,2025

## 2023-06-24 ENCOUNTER — Telehealth: Payer: Self-pay | Admitting: Cardiology

## 2023-06-24 LAB — LIPID PANEL
Chol/HDL Ratio: 2.2 ratio (ref 0.0–5.0)
Cholesterol, Total: 89 mg/dL — ABNORMAL LOW (ref 100–199)
HDL: 41 mg/dL (ref >39)
LDL Chol Calc (NIH): 35 mg/dL (ref 0–99)
Triglycerides: 55 mg/dL (ref 0–149)
VLDL Cholesterol Cal: 13 mg/dL (ref 5–40)

## 2023-06-24 LAB — HEPATIC FUNCTION PANEL
ALT: 22 IU/L (ref 0–44)
AST: 24 IU/L (ref 0–40)
Albumin: 4.6 g/dL (ref 3.9–4.9)
Alkaline Phosphatase: 81 IU/L (ref 44–121)
Bilirubin Total: 1 mg/dL (ref 0.0–1.2)
Bilirubin, Direct: 0.38 mg/dL (ref 0.00–0.40)
Total Protein: 6 g/dL (ref 6.0–8.5)

## 2023-06-24 NOTE — Telephone Encounter (Signed)
 The patient has been notified of the result and verbalized understanding.  All questions (if any) were answered. Alston Berrie Chauvigne, RN 06/24/2023 11:55 AM

## 2023-06-24 NOTE — Telephone Encounter (Signed)
-----   Message from Gaylyn Keas sent at 06/24/2023  9:45 AM EDT ----- Lipids at goal continue current therapy and forward to PCP

## 2023-06-24 NOTE — Telephone Encounter (Signed)
 Patient returned RN's call regarding results.

## 2023-10-01 LAB — LAB REPORT - SCANNED
A1c: 5.6
EGFR: 76

## 2023-12-20 ENCOUNTER — Other Ambulatory Visit: Payer: Self-pay | Admitting: Cardiology

## 2024-01-05 ENCOUNTER — Other Ambulatory Visit: Payer: Self-pay | Admitting: Cardiology

## 2024-02-18 ENCOUNTER — Ambulatory Visit (HOSPITAL_COMMUNITY)
Admission: RE | Admit: 2024-02-18 | Discharge: 2024-02-18 | Disposition: A | Discharge status: Home / Self Care | Source: Ambulatory Visit | Attending: Cardiology | Admitting: Cardiology

## 2024-02-18 ENCOUNTER — Ambulatory Visit: Payer: Self-pay | Admitting: Cardiology

## 2024-02-18 ENCOUNTER — Encounter: Payer: Self-pay | Admitting: Cardiology

## 2024-02-18 DIAGNOSIS — I77810 Thoracic aortic ectasia: Secondary | ICD-10-CM

## 2024-02-18 LAB — ECHOCARDIOGRAM COMPLETE
Area-P 1/2: 4.49 cm2
S' Lateral: 2.7 cm

## 2024-02-24 ENCOUNTER — Telehealth: Payer: Self-pay

## 2024-02-24 DIAGNOSIS — Z79899 Other long term (current) drug therapy: Secondary | ICD-10-CM

## 2024-02-24 DIAGNOSIS — E782 Mixed hyperlipidemia: Secondary | ICD-10-CM

## 2024-02-24 DIAGNOSIS — I2510 Atherosclerotic heart disease of native coronary artery without angina pectoris: Secondary | ICD-10-CM

## 2024-02-24 NOTE — Telephone Encounter (Signed)
 Call to patient to discuss normal echo results, patient verbalizes understanding.

## 2024-02-24 NOTE — Telephone Encounter (Signed)
 Patient calling in to state he is overdue for yearly follow up. He also reports he did not take his repatha  except for 2 doses a year ago. He says his labs at [facility] were fine but no labs from [facility] on file. Patient states he is willing to re-do his labs.

## 2024-02-26 NOTE — Telephone Encounter (Signed)
 Requested labs from [facility], spoke with Laquanda who states she will fax over patient's July 2025 labs as they are most recent.

## 2024-03-07 NOTE — Addendum Note (Signed)
 Addended by: JANIT GENI CROME on: 03/07/2024 10:56 AM   Modules accepted: Orders

## 2024-03-07 NOTE — Telephone Encounter (Signed)
 Call to patient to advise most recent labs from [facility] were from July. Unfortunately we need some more recent, patient agrees to repeat labs, orders placed. Patient verbalizes understanding to go to any [facility] when he is fasting to complete labs.

## 2024-03-19 ENCOUNTER — Other Ambulatory Visit: Payer: Self-pay | Admitting: Cardiology
